# Patient Record
Sex: Female | Born: 2000 | Race: White | Hispanic: No | Marital: Single | Smoking: Current some day smoker
Health system: Southern US, Community
[De-identification: ages and names within clinical notes are randomized; demographics above are authoritative.]

## PROBLEM LIST (undated history)

## (undated) DIAGNOSIS — F988 Other specified behavioral and emotional disorders with onset usually occurring in childhood and adolescence: Secondary | ICD-10-CM

## (undated) DIAGNOSIS — J45909 Unspecified asthma, uncomplicated: Secondary | ICD-10-CM

## (undated) DIAGNOSIS — F32A Depression, unspecified: Secondary | ICD-10-CM

## (undated) DIAGNOSIS — F419 Anxiety disorder, unspecified: Secondary | ICD-10-CM

## (undated) DIAGNOSIS — T7840XA Allergy, unspecified, initial encounter: Secondary | ICD-10-CM

## (undated) DIAGNOSIS — F909 Attention-deficit hyperactivity disorder, unspecified type: Secondary | ICD-10-CM

## (undated) DIAGNOSIS — J4520 Mild intermittent asthma, uncomplicated: Secondary | ICD-10-CM

## (undated) HISTORY — DX: Mild intermittent asthma, uncomplicated: J45.20

## (undated) HISTORY — DX: Allergy, unspecified, initial encounter: T78.40XA

## (undated) HISTORY — PX: INTRAUTERINE DEVICE (IUD) INSERTION: SHX5877

## (undated) HISTORY — DX: Other specified behavioral and emotional disorders with onset usually occurring in childhood and adolescence: F98.8

## (undated) HISTORY — DX: Unspecified asthma, uncomplicated: J45.909

## (undated) HISTORY — DX: Anxiety disorder, unspecified: F41.9

## (undated) HISTORY — PX: WISDOM TOOTH EXTRACTION: SHX21

## (undated) HISTORY — DX: Attention-deficit hyperactivity disorder, unspecified type: F90.9

## (undated) HISTORY — DX: Depression, unspecified: F32.A

## (undated) HISTORY — PX: OTOPLASTY: SUR998

---
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
## 2022-01-05 ENCOUNTER — Encounter: Payer: Self-pay | Admitting: Obstetrics and Gynecology

## 2022-01-05 ENCOUNTER — Ambulatory Visit: Payer: BC Managed Care – PPO | Admitting: Obstetrics and Gynecology

## 2022-01-05 VITALS — BP 104/70 | HR 89 | Wt 114.0 lb

## 2022-01-05 DIAGNOSIS — N941 Unspecified dyspareunia: Secondary | ICD-10-CM | POA: Diagnosis not present

## 2022-01-05 DIAGNOSIS — R102 Pelvic and perineal pain unspecified side: Secondary | ICD-10-CM

## 2022-01-05 DIAGNOSIS — N760 Acute vaginitis: Secondary | ICD-10-CM

## 2022-01-05 LAB — WET PREP FOR TRICH, YEAST, CLUE

## 2022-01-05 NOTE — Progress Notes (Signed)
GYNECOLOGY  VISIT ?  ?HPI: ?21 y.o.   Single White or Caucasian Not Hispanic or Latino  female   ?No obstetric history on file. with No LMP recorded. (Menstrual status: IUD).   ?here for iud check, c/o cramping & vaginal itching  ? ?On 12/16/21 she had a mirena IUD inserted with ultrasound guidance. A few weeks prior she had a partially expulsed IUD removed.  ? ?She was doing fine until yesterday mid day. She had several episodes of severe stabbing pelvic pain, midline to her right side. The pain was lasting for a couple of minutes at time. The pain was up to 9/10 in severity. Currently her pain is gone, she had mild pain 3/10 in severity. ?She has been having vaginal spotting since prior to the IUD insertion.  ? ?She has only had one sexually partner, has intermittent entry discomfort with intercourse. Vagina can feel sore after sex. Not using condoms.  ?No deep dyspareunia. ? ?She has some mild intermittent vaginal itching. She had yeast in 4/23.  ? ?She has a h/o severe cramps, worsened on OCP's in the past.  ? ?She had her first IUD inserted in 10/21 and did really well on it until 4/23. ? ? ?GYNECOLOGIC HISTORY: ?No LMP recorded. (Menstrual status: IUD). ?Contraception:mirena iud inserted 12-16-21 ?Menopausal hormone therapy: none ?       ?OB History   ?No obstetric history on file. ?  ?    ? ?Patient Active Problem List  ? Diagnosis Date Noted  ? 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 Poor sleep hygiene 09/24/2020  ? Weight loss 09/24/2020  ? Lactose intolerance 08/27/2020  ? Peanut allergy 08/11/2020  ? Eczema 08/11/2020  ? Advanced sleep phase syndrome 03/28/2018  ? Environmental allergies 03/02/2012  ? ? ?Past Medical History:  ?Diagnosis Date  ? ADD (attention deficit disorder)   ? ADHD   ? Allergy   ? Phreesia 08/08/2020  ? Anxiety   ? [employer]reesia 08/08/2020  ? Asthma   ? Asthma   ? Phreesia 08/08/2020  ? Asthma, mild intermittent   ? Depression   ? Phreesia 08/08/2020  ? Low  back pain 09/28/2017  ? Pain of great toe, right 05/18/2017  ? Early valgus shift Normal great toe motion  ? Patellofemoral syndrome of right knee 06/26/2018  ? ? ?Past Surgical History:  ?Procedure Laterality Date  ? INTRAUTERINE DEVICE (IUD) INSERTION    ? mirena  ? OTOPLASTY    ? ? ?Current Outpatient Medications  ?Medication Sig Dispense Refill  ? hydrOXYzine (ATARAX/VISTARIL) 10 MG tablet Take 1 tablet (10 mg total) by mouth 3 (three) times daily as needed. 10 tablet 0  ? methylphenidate (RITALIN) 10 MG tablet Take 10 mg by mouth 2 (two) times daily.    ? tacrolimus (PROTOPIC) 0.1 % ointment Apply topically 2 (two) times daily. 100 g 0  ? ?No current facility-administered medications for this visit.  ?  ? ?ALLERGIES: Peanut-containing drug products, Molds & smuts, and Pollen extract ? ?Family History  ?Problem Relation Age of Onset  ? Spondylolisthesis Mother   ? Other Mother   ?     retinal tears  ? Food Allergy Mother   ? Environmental Allergies Mother   ? Asthma Mother   ? Depression Father   ?     lexapro very helpful  ? Hyperlipidemia Father   ? Arthritis Father   ?     s/p hip replacement  ? Bipolar disorder  Maternal Aunt   ?     Died of [place]  ? Migraines Paternal Aunt   ? Depression Paternal Aunt   ? Anxiety disorder Paternal Aunt   ? Colitis Maternal Grandmother   ? Arthritis Maternal Grandmother   ? Diabetes Maternal Grandfather   ? Heart disease Maternal Grandfather   ? Arthritis Maternal Grandfather   ? Fibromyalgia Paternal Grandmother   ? Rheum arthritis Paternal Grandmother   ? Depression Paternal Grandfather   ? Migraines Cousin   ? ADD / ADHD Cousin   ? Autism Cousin   ? Seizures Neg Hx   ? Schizophrenia Neg Hx   ? ? ?Social History  ? ?Socioeconomic History  ? Marital status: Single  ?  Spouse name: Not on file  ? Number of children: Not on file  ? Years of education: Not on file  ? Highest education level: Not on file  ?Occupational History  ? Not on file  ?Tobacco Use  ? Smoking status: Never  ?   Passive exposure: Never  ? Smokeless tobacco: Never  ?Substance and Sexual Activity  ? Alcohol use: Yes  ?  Comment: occ  ? Drug use: Never  ?  Comment: occ  ? Sexual activity: Yes  ?  Partners: Male  ?  Birth control/protection: I.U.D.  ?  Comment: mirena inserted 12-16-21  ?Other Topics Concern  ? Not on file  ?Social History Narrative  ? [name] is a rising 10th grade student at [employer]; she does well in school. She lives with both parents. She enjoys playing violin, playing volleyball, and hanging out with friends.   ? ?Social Determinants of Health  ? ?Financial Resource Strain: Not on file  ?Food Insecurity: Not on file  ?Transportation Needs: Not on file  ?Physical Activity: Not on file  ?Stress: Not on file  ?Social Connections: Not on file  ?Intimate Partner Violence: Not on file  ? ? ?Review of Systems  ?Constitutional: Negative.   ?HENT: Negative.    ?Eyes: Negative.   ?Respiratory: Negative.    ?Cardiovascular: Negative.   ?Gastrointestinal:   ?     Stomach cramping & vaginal itching  ?Genitourinary: Negative.   ?Musculoskeletal: Negative.   ?Skin: Negative.   ?Neurological: Negative.   ?Endo/Heme/Allergies: Negative.   ?Psychiatric/Behavioral: Negative.    ? ?PHYSICAL EXAMINATION:   ? ?BP 104/70   Pulse 89   Wt 114 lb (51.7 kg)   SpO2 92%   BMI 19.57 kg/m?     ?General appearance: alert, cooperative and appears stated age ?Abdomen: soft, mildly tender in the suprapubic and RLQ, no rebound, no guarding; non distended, no masses,  no organomegaly ? ?Pelvic: External genitalia:  no lesions, mild erythema ?             Urethra:  normal appearing urethra with no masses, tenderness or lesions ?             Bartholins and Skenes: normal    ?             Vagina: mildly erythematous appearing vagina with normal color and discharge, no lesions ?             Cervix: no cervical motion tenderness, no lesions, and IUD string 1 cm ?             Bimanual Exam:  Uterus:  normal size, contour, position,  consistency, mobility, non-tender and anteverted ?  Adnexa:  no masses, mildly tender in the right adnexa.  ?              ?Chaperone was present for exam. ? ?1. Pelvic pain ?Minimal right adnexal tenderness on exam. Recent negative genprobe ?- US PELVIS TRANSVAGINAL NON-OB (TV ONLY); Future ? ?2. Acute vaginitis ?- WET PREP FOR TRICH, YEAST, CLUE ?- SureSwab? Advanced Vaginitis, TMA ? ?3. Dyspareunia in female ?Send vaginitis panel ?Samples of uberlube given ? ? ?

## 2022-01-06 ENCOUNTER — Ambulatory Visit: Payer: BC Managed Care – PPO | Admitting: Obstetrics and Gynecology

## 2022-01-06 ENCOUNTER — Ambulatory Visit (INDEPENDENT_AMBULATORY_CARE_PROVIDER_SITE_OTHER): Payer: BC Managed Care – PPO

## 2022-01-06 ENCOUNTER — Encounter: Payer: Self-pay | Admitting: Obstetrics and Gynecology

## 2022-01-06 DIAGNOSIS — R102 Pelvic and perineal pain: Secondary | ICD-10-CM | POA: Diagnosis not present

## 2022-01-06 DIAGNOSIS — F4011 Social phobia, generalized: Secondary | ICD-10-CM | POA: Diagnosis not present

## 2022-01-06 DIAGNOSIS — F330 Major depressive disorder, recurrent, mild: Secondary | ICD-10-CM | POA: Diagnosis not present

## 2022-01-06 DIAGNOSIS — N83201 Unspecified ovarian cyst, right side: Secondary | ICD-10-CM

## 2022-01-06 NOTE — Progress Notes (Signed)
GYNECOLOGY  VISIT   HPI: 21 y.o.   Single White or Caucasian Not Hispanic or Latino  female   No obstetric history on file. with No LMP recorded. (Menstrual status: IUD).   here for f/u of pelvic pain. She had an IUD placed last month and presented yesterday with c/o severe pelvic pain the day prior to her visit. She continues to have intermittent pain, but it is improving. On exam yesterday she was noted to have mild tenderness in the right adnexa.   GYNECOLOGIC HISTORY: No LMP recorded. (Menstrual status: IUD). Contraception:IUD Menopausal hormone therapy: NA        OB History   No obstetric history on file.        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) 09/24/2020   Poor sleep hygiene 09/24/2020   Weight loss 09/24/2020   Lactose intolerance 08/27/2020   Peanut allergy 08/11/2020   Eczema 08/11/2020   Advanced sleep phase syndrome 03/28/2018   Environmental allergies 03/02/2012    Past Medical History:  Diagnosis Date   ADD (attention deficit disorder)    ADHD    Allergy    Phreesia 08/08/2020   Anxiety    Phreesia 08/08/2020   Asthma    Asthma    Phreesia 08/08/2020   Asthma, mild intermittent    Depression    Phreesia 08/08/2020   Low back pain 09/28/2017   Pain of great toe, right 05/18/2017   Early valgus shift Normal great toe motion   Patellofemoral syndrome of right knee 06/26/2018    Past Surgical History:  Procedure Laterality Date   INTRAUTERINE DEVICE (IUD) INSERTION     mirena   OTOPLASTY      Current Outpatient Medications  Medication Sig Dispense Refill   hydrOXYzine (ATARAX/VISTARIL) 10 MG tablet Take 1 tablet (10 mg total) by mouth 3 (three) times daily as needed. 10 tablet 0   methylphenidate (RITALIN) 10 MG tablet Take 10 mg by mouth 2 (two) times daily.     tacrolimus (PROTOPIC) 0.1 % ointment Apply topically 2 (two) times daily. 100 g 0   No current  facility-administered medications for this visit.     ALLERGIES: Peanut-containing drug products, Molds & smuts, and Pollen extract  Family History  Problem Relation Age of Onset   Spondylolisthesis Mother    Other Mother        retinal tears   Food Allergy Mother    Environmental Allergies Mother    Asthma Mother    Depression Father        lexapro very helpful   Hyperlipidemia Father    Arthritis Father        s/p hip replacement   Bipolar disorder Maternal Aunt        Died of [place]   Migraines Paternal Aunt    Depression Paternal Aunt    Anxiety disorder Paternal Aunt    Colitis Maternal Grandmother    Arthritis Maternal Grandmother    Diabetes Maternal Grandfather    Heart disease Maternal Grandfather    Arthritis Maternal Grandfather    Fibromyalgia Paternal Grandmother    Rheum arthritis Paternal Grandmother    Depression Paternal Grandfather    Migraines Cousin    ADD / ADHD Cousin    Autism Cousin    Seizures Neg Hx    Schizophrenia Neg Hx     Social History   Socioeconomic History   Marital status: Single  Spouse name: Not on file   Number of children: Not on file   Years of education: Not on file   Highest education level: Not on file  Occupational History   Not on file  Tobacco Use   Smoking status: Never    Passive exposure: Never   Smokeless tobacco: Never  Substance and Sexual Activity   Alcohol use: Yes    Comment: occ   Drug use: Never    Comment: occ   Sexual activity: Yes    Partners: Male    Birth control/protection: I.U.D.    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 She lives with both parents. She enjoys playing violin, playing volleyball, and hanging out with friends.    Social Determinants of Health   Financial Resource Strain: Not on file  Food Insecurity: Not on file  Transportation  Needs: Not on file  Physical Activity: Not on file  Stress: Not on file  Social Connections: Not on file  Intimate Partner Violence: Not on file    ROS  PHYSICAL EXAMINATION:    There were no vitals taken for this visit.    General appearance: alert, cooperative and appears stated age  Pelvic ultrasound  Indications: Pelvic pain  Findings:  Uterus 6.61 x 4.2 x 3.09 cm  Endometrium 1.75 mm IUD is best seen on 3D imaging, the IUD is in the uterine cavity, but is lower in the cavity than it was. By my measurement the body of the IUD is now 42mm from the fundus. One arm is lower than the other  Left ovary 2.24 x 1.26 x 1.46 cm  Right ovary 4.10 x 3.74 x 3.73 cm 3.77 x 3.34 cm complex cyst, c/w hemorrhagic CL Normal perfusion  No free fluid   Impression:  Normal sized, anteverted uterus Hemorrhagic cyst in the right ovary IUD in the uterine cavity, but has moved down some  Thin endometrium Normal left ovary  1. Right ovarian cyst Hemorrhagic cyst on the right - US PELVIS TRANSVAGINAL NON-OB (TV ONLY); Future  2. Pelvic pain I think her pain is from the hemorrhagic cyst, that is where she is most tender. Her IUD has moved down in her cavity slightly, but is still in her cavity. She previously expulsed an IUD - US PELVIS TRANSVAGINAL NON-OB (TV ONLY); Future in 2 months

## 2022-01-07 LAB — SURESWAB® ADVANCED VAGINITIS,TMA
CANDIDA SPECIES: NOT DETECTED
Candida glabrata: NOT DETECTED
SURESWAB(R) ADV BACTERIAL VAGINOSIS(BV),TMA: NEGATIVE
TRICHOMONAS VAGINALIS (TV),TMA: NOT DETECTED

## 2022-02-03 DIAGNOSIS — F330 Major depressive disorder, recurrent, mild: Secondary | ICD-10-CM | POA: Diagnosis not present

## 2022-02-03 DIAGNOSIS — F4011 Social phobia, generalized: Secondary | ICD-10-CM | POA: Diagnosis not present

## 2022-02-10 DIAGNOSIS — F4011 Social phobia, generalized: Secondary | ICD-10-CM | POA: Diagnosis not present

## 2022-02-10 DIAGNOSIS — F330 Major depressive disorder, recurrent, mild: Secondary | ICD-10-CM | POA: Diagnosis not present

## 2022-02-17 DIAGNOSIS — F988 Other specified behavioral and emotional disorders with onset usually occurring in childhood and adolescence: Secondary | ICD-10-CM | POA: Diagnosis not present

## 2022-02-17 DIAGNOSIS — F330 Major depressive disorder, recurrent, mild: Secondary | ICD-10-CM | POA: Diagnosis not present

## 2022-02-17 DIAGNOSIS — Z79899 Other long term (current) drug therapy: Secondary | ICD-10-CM | POA: Diagnosis not present

## 2022-02-17 DIAGNOSIS — F419 Anxiety disorder, unspecified: Secondary | ICD-10-CM | POA: Diagnosis not present

## 2022-02-18 DIAGNOSIS — F4011 Social phobia, generalized: Secondary | ICD-10-CM | POA: Diagnosis not present

## 2022-02-18 DIAGNOSIS — F330 Major depressive disorder, recurrent, mild: Secondary | ICD-10-CM | POA: Diagnosis not present

## 2022-02-24 DIAGNOSIS — F4011 Social phobia, generalized: Secondary | ICD-10-CM | POA: Diagnosis not present

## 2022-02-24 DIAGNOSIS — F330 Major depressive disorder, recurrent, mild: Secondary | ICD-10-CM | POA: Diagnosis not present

## 2022-03-02 DIAGNOSIS — J3081 Allergic rhinitis due to animal (cat) (dog) hair and dander: Secondary | ICD-10-CM | POA: Diagnosis not present

## 2022-03-02 DIAGNOSIS — Z91010 Allergy to peanuts: Secondary | ICD-10-CM | POA: Diagnosis not present

## 2022-03-02 DIAGNOSIS — J4530 Mild persistent asthma, uncomplicated: Secondary | ICD-10-CM | POA: Diagnosis not present

## 2022-03-02 DIAGNOSIS — J301 Allergic rhinitis due to pollen: Secondary | ICD-10-CM | POA: Diagnosis not present

## 2022-03-02 DIAGNOSIS — J3089 Other allergic rhinitis: Secondary | ICD-10-CM | POA: Diagnosis not present

## 2022-03-03 DIAGNOSIS — F4011 Social phobia, generalized: Secondary | ICD-10-CM | POA: Diagnosis not present

## 2022-03-03 DIAGNOSIS — F330 Major depressive disorder, recurrent, mild: Secondary | ICD-10-CM | POA: Diagnosis not present

## 2022-03-10 ENCOUNTER — Ambulatory Visit (INDEPENDENT_AMBULATORY_CARE_PROVIDER_SITE_OTHER): Payer: BC Managed Care – PPO | Admitting: Obstetrics and Gynecology

## 2022-03-10 ENCOUNTER — Ambulatory Visit (INDEPENDENT_AMBULATORY_CARE_PROVIDER_SITE_OTHER): Payer: BC Managed Care – PPO

## 2022-03-10 ENCOUNTER — Encounter: Payer: Self-pay | Admitting: Obstetrics and Gynecology

## 2022-03-10 DIAGNOSIS — Z8742 Personal history of other diseases of the female genital tract: Secondary | ICD-10-CM | POA: Diagnosis not present

## 2022-03-10 DIAGNOSIS — R102 Pelvic and perineal pain: Secondary | ICD-10-CM

## 2022-03-10 DIAGNOSIS — N83201 Unspecified ovarian cyst, right side: Secondary | ICD-10-CM

## 2022-03-10 DIAGNOSIS — T8332XD Displacement of intrauterine contraceptive device, subsequent encounter: Secondary | ICD-10-CM | POA: Diagnosis not present

## 2022-03-10 DIAGNOSIS — F330 Major depressive disorder, recurrent, mild: Secondary | ICD-10-CM | POA: Diagnosis not present

## 2022-03-10 DIAGNOSIS — Z3009 Encounter for other general counseling and advice on contraception: Secondary | ICD-10-CM | POA: Diagnosis not present

## 2022-03-10 DIAGNOSIS — F4011 Social phobia, generalized: Secondary | ICD-10-CM | POA: Diagnosis not present

## 2022-03-10 NOTE — Progress Notes (Signed)
GYNECOLOGY  VISIT   HPI: 21 y.o.   Single White or Caucasian Not Hispanic or Latino  female   No obstetric history on file. with No LMP recorded. (Menstrual status: IUD).   here for Ultrasound consult.    She has a h/o a partially expulsed IUD, had the IUD removed on 12/02/21 and another one placed with u/s guidance. IUD in proper location after ultrasound guided insertion on 12/16/21.   She then returned on 01/06/22 for an ultrasound for pelvic pain. U/S showed a complex 3.8 cm right ovarian cyst and her IUD was noted to be lower in her uterine cavity, 71mm from the fundus (one arm lower than the other as well).   She presented today for a f/u ultrasound. She is not currently having any pain. She had her cycle last week and had moderate cramping. Not as bad as it has been previously.  She is sexually active, same partner. Doesn't typically have pain with sex although she did have pain last week during intercourse. She didn't have any pain with the ultrasound today.   She has a h/o an aura x 1, so went off of OCP's. She had mood changes on the micronor previously.   She needs contraception, but also needs control of severe dysmenorrhea.   GYNECOLOGIC HISTORY: No LMP recorded. (Menstrual status: IUD). Contraception:IUD Menopausal hormone therapy: none         OB History   No obstetric history on file.        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) 09/24/2020   Poor sleep hygiene 09/24/2020   Weight loss 09/24/2020   Lactose intolerance 08/27/2020   Peanut allergy 08/11/2020   Eczema 08/11/2020   Advanced sleep phase syndrome 03/28/2018   Environmental allergies 03/02/2012    Past Medical History:  Diagnosis Date   ADD (attention deficit disorder)    ADHD    Allergy    Phreesia 08/08/2020   Anxiety    Phreesia 08/08/2020   Asthma    Asthma    Phreesia 08/08/2020   Asthma, mild intermittent     Depression    Phreesia 08/08/2020   Low back pain 09/28/2017   Pain of great toe, right 05/18/2017   Early valgus shift Normal great toe motion   Patellofemoral syndrome of right knee 06/26/2018    Past Surgical History:  Procedure Laterality Date   INTRAUTERINE DEVICE (IUD) INSERTION     mirena   OTOPLASTY      Current Outpatient Medications  Medication Sig Dispense Refill   hydrOXYzine (ATARAX/VISTARIL) 10 MG tablet Take 1 tablet (10 mg total) by mouth 3 (three) times daily as needed. 10 tablet 0   methylphenidate (RITALIN) 10 MG tablet Take 10 mg by mouth 2 (two) times daily.     tacrolimus (PROTOPIC) 0.1 % ointment Apply topically 2 (two) times daily. 100 g 0   No current facility-administered medications for this visit.     ALLERGIES: Peanut-containing drug products, Molds & smuts, and Pollen extract  Family History  Problem Relation Age of Onset   Spondylolisthesis Mother    Other Mother        retinal tears   Food Allergy Mother    Environmental Allergies Mother    Asthma Mother    Depression Father        lexapro very helpful   Hyperlipidemia Father    Arthritis Father  s/p hip replacement   Bipolar disorder Maternal Aunt        Died of COVID   Migraines Paternal Aunt    Depression Paternal Aunt    Anxiety disorder Paternal Aunt    Colitis Maternal Grandmother    Arthritis Maternal Grandmother    Diabetes Maternal Grandfather    Heart disease Maternal Grandfather    Arthritis Maternal Grandfather    Fibromyalgia Paternal Grandmother    Rheum arthritis Paternal Grandmother    Depression Paternal Grandfather    Migraines Cousin    ADD / ADHD Cousin    Autism Cousin    Seizures Neg Hx    Schizophrenia Neg Hx     Social History   Socioeconomic History   Marital status: Single    Spouse name: Not on file   Number of children: Not on file   Years of education: Not on file   Highest education level: Not on file  Occupational History   Not on file   Tobacco Use   Smoking status: Never    Passive exposure: Never   Smokeless tobacco: Never  Substance and Sexual Activity   Alcohol use: Yes    Comment: occ   Drug use: Never    Comment: occ   Sexual activity: Yes    Partners: Male    Birth control/protection: I.U.D.    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 She lives with both parents. She enjoys playing violin, playing volleyball, and hanging out with friends.    Social Determinants of Health   Financial Resource Strain: Not on file  Food Insecurity: Not on file  Transportation Needs: Not on file  Physical Activity: Not on file  Stress: Not on file  Social Connections: Not on file  Intimate Partner Violence: Not on file    Review of Systems  All other systems reviewed and are negative.   PHYSICAL EXAMINATION:    There were no vitals taken for this visit.    General appearance: alert, cooperative and appears stated age  Ultrasound images reviewed. Prior cyst has resolved. Her IUD is moving lower in her uterine cavity, now just above her cervix. One arm is lower than the other.  1. Malpositioned intrauterine device (IUD), subsequent encounter The IUD is slowly moving down in her uterine cavity. I recommend removal. She is going to [place] and doesn't want to remove the IUD prior to her trip. Not currently in pain. We discussed that there is a risk that the IUD will continue to expulse and could cause her pain. If that were to happen when she was gone, she could go to a clinic and ask for removal. -She will consider, if she wants me to remove it prior to her trip she will call -recommend she use condoms  2. General counseling and advice on female contraception We discussed other options for contraception, unless she has Neurology approval I would be very hesitant to start her on OCP's  given her h/o aura x 1. Other options are POP (previously had mood changes), nexplanon, depo-provera. Discussed possible side effects with each.  She will let me know how she wants to proceed.  3. History of dysmenorrhea  In addition to reviewing the ultrasound results, over 20 minutes was spent in total patient care (I saw her prior to her being added onto  my schedule for a visit).

## 2022-03-11 ENCOUNTER — Telehealth: Payer: Self-pay | Admitting: *Deleted

## 2022-03-11 DIAGNOSIS — G43101 Migraine with aura, not intractable, with status migrainosus: Secondary | ICD-10-CM

## 2022-03-11 NOTE — Telephone Encounter (Signed)
Dr.Jertson called in triage asking me to place a referral for patient. Dr.Jertson said patient mother called her last night and asked if referral could be placed for neurology for migraine with aura.   Referral placed.

## 2022-03-14 ENCOUNTER — Encounter: Payer: Self-pay | Admitting: Obstetrics and Gynecology

## 2022-03-14 ENCOUNTER — Ambulatory Visit (INDEPENDENT_AMBULATORY_CARE_PROVIDER_SITE_OTHER): Payer: BC Managed Care – PPO | Admitting: Obstetrics and Gynecology

## 2022-03-14 VITALS — BP 110/68 | HR 99 | Ht 64.0 in | Wt 114.0 lb

## 2022-03-14 DIAGNOSIS — Z30432 Encounter for removal of intrauterine contraceptive device: Secondary | ICD-10-CM

## 2022-03-14 NOTE — Progress Notes (Signed)
GYNECOLOGY  VISIT   HPI: 21 y.o.   Single White or Caucasian Not Hispanic or Latino  female   No obstetric history on file. with No LMP recorded. (Menstrual status: IUD).   here for  IUD removal. The IUD was noted to be in her LUS on imaging last week. She restarted POP last week.   GYNECOLOGIC HISTORY: No LMP recorded. (Menstrual status: IUD). Contraception:IUD  Menopausal hormone therapy: none         OB History   No obstetric history on file.        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) 09/24/2020   Poor sleep hygiene 09/24/2020   Weight loss 09/24/2020   Lactose intolerance 08/27/2020   Peanut allergy 08/11/2020   Eczema 08/11/2020   Advanced sleep phase syndrome 03/28/2018   Environmental allergies 03/02/2012    Past Medical History:  Diagnosis Date   ADD (attention deficit disorder)    ADHD    Allergy    Phreesia 08/08/2020   Anxiety    Phreesia 08/08/2020   Asthma    Asthma    Phreesia 08/08/2020   Asthma, mild intermittent    Depression    Phreesia 08/08/2020   Low back pain 09/28/2017   Pain of great toe, right 05/18/2017   Early valgus shift Normal great toe motion   Patellofemoral syndrome of right knee 06/26/2018    Past Surgical History:  Procedure Laterality Date   INTRAUTERINE DEVICE (IUD) INSERTION     mirena   OTOPLASTY      Current Outpatient Medications  Medication Sig Dispense Refill   hydrOXYzine (ATARAX/VISTARIL) 10 MG tablet Take 1 tablet (10 mg total) by mouth 3 (three) times daily as needed. 10 tablet 0   methylphenidate (RITALIN) 10 MG tablet Take 10 mg by mouth 2 (two) times daily.     tacrolimus (PROTOPIC) 0.1 % ointment Apply topically 2 (two) times daily. 100 g 0   No current facility-administered medications for this visit.     ALLERGIES: Peanut-containing drug products, Molds & smuts, and Pollen extract  Family History  Problem Relation Age of  Onset   Spondylolisthesis Mother    Other Mother        retinal tears   Food Allergy Mother    Environmental Allergies Mother    Asthma Mother    Depression Father        lexapro very helpful   Hyperlipidemia Father    Arthritis Father        s/p hip replacement   Bipolar disorder Maternal Aunt        Died of COVID   Migraines Paternal Aunt    Depression Paternal Aunt    Anxiety disorder Paternal Aunt    Colitis Maternal Grandmother    Arthritis Maternal Grandmother    Diabetes Maternal Grandfather    Heart disease Maternal Grandfather    Arthritis Maternal Grandfather    Fibromyalgia Paternal Grandmother    Rheum arthritis Paternal Grandmother    Depression Paternal Grandfather    Migraines Cousin    ADD / ADHD Cousin    Autism Cousin    Seizures Neg Hx    Schizophrenia Neg Hx     Social History   Socioeconomic History   Marital status: Single    Spouse name: Not on file   Number of children: Not on file   Years of education: Not on file   Highest  education level: Not on file  Occupational History   Not on file  Tobacco Use   Smoking status: Never    Passive exposure: Never   Smokeless tobacco: Never  Substance and Sexual Activity   Alcohol use: Yes    Comment: occ   Drug use: Never    Comment: occ   Sexual activity: Yes    Partners: Male    Birth control/protection: I.U.D.    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 She lives with both parents. She enjoys playing violin, playing volleyball, and hanging out with friends.    Social Determinants of Health   Financial Resource Strain: Not on file  Food Insecurity: Not on file  Transportation Needs: Not on file  Physical Activity: Not on file  Stress: Not on file  Social Connections: Not on file  Intimate Partner Violence: Not on file    Review of Systems  All other  systems reviewed and are negative.   PHYSICAL EXAMINATION:    There were no vitals taken for this visit.    General appearance: alert, cooperative and appears stated age  Pelvic: External genitalia:  no lesions              Urethra:  normal appearing urethra with no masses, tenderness or lesions              Bartholins and Skenes: normal                 Vagina: normal appearing vagina with normal color and discharge, no lesions              Cervix: no lesions and IUD strings 3-4 cm, IUD pulled with ringed forceps               Chaperone was present for exam.  1. Encounter for IUD removal IUD removed She is on POP, aware she needs to use condoms for one week for back up contraception. Condom use encouraged for STD protection.

## 2022-03-16 ENCOUNTER — Encounter: Payer: Self-pay | Admitting: Obstetrics and Gynecology

## 2022-03-16 ENCOUNTER — Telehealth: Payer: Self-pay | Admitting: *Deleted

## 2022-03-16 ENCOUNTER — Ambulatory Visit: Payer: BC Managed Care – PPO | Admitting: Obstetrics and Gynecology

## 2022-03-16 VITALS — BP 118/80 | Ht 64.0 in | Wt 114.0 lb

## 2022-03-16 DIAGNOSIS — N939 Abnormal uterine and vaginal bleeding, unspecified: Secondary | ICD-10-CM | POA: Diagnosis not present

## 2022-03-16 MED ORDER — NORETHINDRONE ACETATE 5 MG PO TABS: ORAL_TABLET | ORAL | 0 refills | Status: DC

## 2022-03-16 NOTE — Progress Notes (Signed)
GYNECOLOGY  VISIT   HPI: 21 y.o.   Single White or Caucasian Not Hispanic or Latino  female   No obstetric history on file. with Patient's last menstrual period was 03/02/2022.   here for irregular bleeding. She had an ultrasound last week and was noted to have a partially expulsed IUD. She started on micronor last Thursday and had her IUD pulled on Monday.  She reports heavy bleeding overnight, saturating a super tampon overnight and then again in 2-3 hours this morning. Currently bleeding has slowed down. No pain.   GYNECOLOGIC HISTORY: Patient's last menstrual period was 03/02/2022. 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) 09/24/2020   Poor sleep hygiene 09/24/2020   Weight loss 09/24/2020   Lactose intolerance 08/27/2020   Peanut allergy 08/11/2020   Eczema 08/11/2020   Advanced sleep phase syndrome 03/28/2018   Environmental allergies 03/02/2012    Past Medical History:  Diagnosis Date   ADD (attention deficit disorder)    ADHD    Allergy    Phreesia 08/08/2020   Anxiety    Phreesia 08/08/2020   Asthma    Asthma    Phreesia 08/08/2020   Asthma, mild intermittent    Depression    Phreesia 08/08/2020   Low back pain 09/28/2017   Pain of great toe, right 05/18/2017   Early valgus shift Normal great toe motion   Patellofemoral syndrome of right knee 06/26/2018    Past Surgical History:  Procedure Laterality Date   INTRAUTERINE DEVICE (IUD) INSERTION     mirena   OTOPLASTY      Current Outpatient Medications  Medication Sig Dispense Refill   hydrOXYzine (ATARAX/VISTARIL) 10 MG tablet Take 1 tablet (10 mg total) by mouth 3 (three) times daily as  needed. 10 tablet 0   methylphenidate (RITALIN) 10 MG tablet Take 10 mg by mouth 2 (two) times daily.     norethindrone (MICRONOR) 0.35 MG tablet Take 1 tablet by mouth daily.     tacrolimus (PROTOPIC) 0.1 % ointment Apply topically 2 (two) times daily. 100 g 0   No current facility-administered medications for this visit.     ALLERGIES: Peanut-containing drug products, Molds & smuts, and Pollen extract  Family History  Problem Relation Age of Onset   Spondylolisthesis Mother    Other Mother        retinal tears   Food Allergy Mother    Environmental Allergies Mother    Asthma Mother    Depression Father        lexapro very helpful   Hyperlipidemia Father    Arthritis Father        s/p hip replacement   Bipolar disorder Maternal Aunt        Died of COVID   Migraines Paternal Aunt    Depression Paternal Aunt    Anxiety disorder Paternal Aunt    Colitis Maternal Grandmother    Arthritis Maternal  Grandmother    Diabetes Maternal Grandfather    Heart disease Maternal Grandfather    Arthritis Maternal Grandfather    Fibromyalgia Paternal Grandmother    Rheum arthritis Paternal Grandmother    Depression Paternal Grandfather    Migraines Cousin    ADD / ADHD Cousin    Autism Cousin    Seizures Neg Hx    Schizophrenia Neg Hx     Social History   Socioeconomic History   Marital status: Single    Spouse name: Not on file   Number of children: Not on file   Years of education: Not on file   Highest education level: Not on file  Occupational History   Not on file  Tobacco Use   Smoking status: Never    Passive exposure: Never   Smokeless tobacco: Never  Substance and Sexual Activity   Alcohol use: Yes    Comment: occ   Drug use: Never    Comment: occ   Sexual activity: Yes    Partners: Male    Birth control/protection: I.U.D.    Comment: mirena inserted 12-16-21  Other Topics Concern   Not on file  Social History Narrative   Barbara Mcmillan is a rising 10th grade student  at [employer]; she does well in school. She lives with both parents. She enjoys playing violin, playing volleyball, and hanging out with friends.    Social Determinants of Health   Financial Resource Strain: Not on file  Food Insecurity: Not on file  Transportation Needs: Not on file  Physical Activity: Not on file  Stress: Not on file  Social Connections: Not on file  Intimate Partner Violence: Not on file    ROS  PHYSICAL EXAMINATION:    BP 118/80 (BP Location: Right Arm, Patient Position: Sitting)   Ht 5\' 4"  (1.626 m)   Wt 114 lb (51.7 kg)   LMP 03/02/2022   SpO2 99%   BMI 19.57 kg/m     General appearance: alert, cooperative and appears stated age  Pelvic: External genitalia:  no lesions              Urethra:  normal appearing urethra with no masses, tenderness or lesions              Bartholins and Skenes: normal                 Vagina: normal appearing vagina with normal color and discharge, no lesions. There is a small amount of blood in the vagina              Cervix: no lesions and no active bleeding              Bimanual Exam:  Uterus:  normal size, contour, position, consistency, mobility, non-tender              Adnexa: no mass, fullness, tenderness               Chaperone was present for exam.  1. Abnormal uterine bleeding (AUB) The patient had a malpositioned (expulsing) IUD removed on Monday, she started on micronor last week. She reports heavy bleeding overnight and this am. She is going to [place] on Friday and is concerned about having heavy bleeding there. -Currently on exam she is not actively bleeding. Patient was reassured that I don't feel anything dangerous is happening. With any hormonal changes there can be some bleeding, I can't predict how long this will go on for, but don't expect her  to hemorrhage. Will give her a script of Aygestin to take to [place] with her, she will only take it if she is bleeding through a tampon an hour.  - norethindrone  (AYGESTIN) 5 MG tablet; If your bleeding gets very heavy, take one tablet a day until bleeding subsides, then take one tablet a day.  Dispense: 20 tablet; Refill: 0 -F/U as needed

## 2022-03-16 NOTE — Telephone Encounter (Signed)
Dr.Jertson asked to me call patient had IUD removed 03/10/22 reports her mother sent text to Dr.Jertson about this. I called patient and she asked to speak with Dr. Oscar La I explained she is seeing patient but she could talk to me. Patient said bleeding started last night and it was heavy, this am she reports wearing both tampon and pad. States she has not changed tampon/pad this am yet, but is not changing less than 1 hour so far. I told her Dr. Oscar La would like to see her at 3:45pm as work in. Patient scheduled by appointments.

## 2022-03-17 NOTE — Telephone Encounter (Signed)
Per referral coordinator from Neurology "Spoke w pt and offered our first available on 8/18. She believes she'll be moving for college by then so she wanted to hold off. Will give [place] a call if anything changes or if she decides to schedule"  Encounter/referral closed.

## 2022-03-22 ENCOUNTER — Other Ambulatory Visit: Payer: Self-pay

## 2022-03-31 DIAGNOSIS — F4011 Social phobia, generalized: Secondary | ICD-10-CM | POA: Diagnosis not present

## 2022-03-31 DIAGNOSIS — F330 Major depressive disorder, recurrent, mild: Secondary | ICD-10-CM | POA: Diagnosis not present

## 2022-04-07 DIAGNOSIS — F330 Major depressive disorder, recurrent, mild: Secondary | ICD-10-CM | POA: Diagnosis not present

## 2022-04-07 DIAGNOSIS — F4011 Social phobia, generalized: Secondary | ICD-10-CM | POA: Diagnosis not present

## 2022-04-11 DIAGNOSIS — F4011 Social phobia, generalized: Secondary | ICD-10-CM | POA: Diagnosis not present

## 2022-04-11 DIAGNOSIS — F330 Major depressive disorder, recurrent, mild: Secondary | ICD-10-CM | POA: Diagnosis not present

## 2022-04-12 DIAGNOSIS — F330 Major depressive disorder, recurrent, mild: Secondary | ICD-10-CM | POA: Diagnosis not present

## 2022-04-12 DIAGNOSIS — F4011 Social phobia, generalized: Secondary | ICD-10-CM | POA: Diagnosis not present

## 2022-04-18 DIAGNOSIS — F4011 Social phobia, generalized: Secondary | ICD-10-CM | POA: Diagnosis not present

## 2022-04-18 DIAGNOSIS — F330 Major depressive disorder, recurrent, mild: Secondary | ICD-10-CM | POA: Diagnosis not present

## 2022-04-28 ENCOUNTER — Ambulatory Visit
Admission: RE | Admit: 2022-04-28 | Discharge: 2022-04-28 | Disposition: A | Discharge status: Home / Self Care | Payer: BC Managed Care – PPO | Source: Ambulatory Visit | Attending: Urgent Care | Admitting: Urgent Care

## 2022-04-28 VITALS — BP 109/73 | HR 98 | Temp 98.4°F | Resp 18

## 2022-04-28 DIAGNOSIS — Z1152 Encounter for screening for COVID-19: Secondary | ICD-10-CM

## 2022-04-28 DIAGNOSIS — U071 COVID-19: Secondary | ICD-10-CM | POA: Diagnosis not present

## 2022-04-28 DIAGNOSIS — Z87891 Personal history of nicotine dependence: Secondary | ICD-10-CM

## 2022-04-28 DIAGNOSIS — J4530 Mild persistent asthma, uncomplicated: Secondary | ICD-10-CM | POA: Diagnosis not present

## 2022-04-28 DIAGNOSIS — J309 Allergic rhinitis, unspecified: Secondary | ICD-10-CM

## 2022-04-28 MED ORDER — PAXLOVID (300/100) 20 X 150 MG & 10 X 100MG PO TBPK: ORAL_TABLET | ORAL | 0 refills | Status: DC

## 2022-04-28 MED ORDER — PSEUDOEPHEDRINE HCL 30 MG PO TABS: 30.0000 mg | ORAL_TABLET | Freq: Three times a day (TID) | ORAL | 0 refills | Status: DC | PRN

## 2022-04-28 MED ORDER — CETIRIZINE HCL 10 MG PO TABS: 10.0000 mg | ORAL_TABLET | Freq: Every day | ORAL | 0 refills | Status: DC

## 2022-04-28 MED ORDER — PROMETHAZINE-DM 6.25-15 MG/5ML PO SYRP: 2.5000 mL | ORAL_SOLUTION | Freq: Three times a day (TID) | ORAL | 0 refills | Status: DC | PRN

## 2022-04-28 MED ORDER — ALBUTEROL SULFATE HFA 108 (90 BASE) MCG/ACT IN AERS: 1.0000 | INHALATION_SPRAY | Freq: Four times a day (QID) | RESPIRATORY_TRACT | 0 refills | Status: AC | PRN

## 2022-04-28 NOTE — ED Triage Notes (Signed)
Pt here with cough and congestion and positive covid test x 4 days. Pt needs official covid test and letter to go back on Monday.

## 2022-04-28 NOTE — ED Provider Notes (Signed)
[facility]  Note:  This document was prepared using Conservation officer, historic buildings and may include unintentional dictation errors.  MRN: [id number] DOB: 07-03-2001  Subjective:   Barbara Mcmillan is a 21 y.o. female presenting for COVID testing.  Patient had a positive test and needs a confirmation for her schooling.  She has a history of asthma.  Would like COVID antiviral medication.  No history of kidney disease.  Patient has had persistent coughing, sinus congestion and drainage.  She has been smoking intermittently with cigarettes or vaping.    No current facility-administered medications for this encounter.  Current Outpatient Medications:    hydrOXYzine (ATARAX/VISTARIL) 10 MG tablet, Take 1 tablet (10 mg total) by mouth 3 (three) times daily as needed., Disp: 10 tablet, Rfl: 0   methylphenidate (RITALIN) 10 MG tablet, Take 10 mg by mouth 2 (two) times daily., Disp: , Rfl:    norethindrone (AYGESTIN) 5 MG tablet, If your bleeding gets very heavy, take one tablet a day until bleeding subsides, then take one tablet a day., Disp: 20 tablet, Rfl: 0   norethindrone (MICRONOR) 0.35 MG tablet, Take 1 tablet by mouth daily., Disp: , Rfl:    tacrolimus (PROTOPIC) 0.1 % ointment, Apply topically 2 (two) times daily., Disp: 100 g, Rfl: 0   Allergies  Allergen Reactions   Peanut-Containing Drug Products Anaphylaxis, Hives, Itching, Rash, Shortness Of Breath and Swelling   Molds & Smuts    Pollen Extract     Past Medical History:  Diagnosis Date   ADD (attention deficit disorder)    ADHD    Allergy    Phreesia 08/08/2020   Anxiety    Phreesia 08/08/2020   Asthma    Asthma    Phreesia 08/08/2020   Asthma, mild intermittent    Depression    Phreesia 08/08/2020   Low back pain 09/28/2017   Pain of great toe, right 05/18/2017   Early valgus shift Normal great toe motion   Patellofemoral syndrome of right knee 06/26/2018     Past Surgical History:   Procedure Laterality Date   INTRAUTERINE DEVICE (IUD) INSERTION     mirena   OTOPLASTY      Family History  Problem Relation Age of Onset   Spondylolisthesis Mother    Other Mother        retinal tears   Food Allergy Mother    Environmental Allergies Mother    Asthma Mother    Depression Father        lexapro very helpful   Hyperlipidemia Father    Arthritis Father        s/p hip replacement   Bipolar disorder Maternal Aunt        Died of COVID   Migraines Paternal Aunt    Depression Paternal Aunt    Anxiety disorder Paternal Aunt    Colitis Maternal Grandmother    Arthritis Maternal Grandmother    Diabetes Maternal Grandfather    Heart disease Maternal Grandfather    Arthritis Maternal Grandfather    Fibromyalgia Paternal Grandmother    Rheum arthritis Paternal Grandmother    Depression Paternal Grandfather    Migraines Cousin    ADD / ADHD Cousin    Autism Cousin    Seizures Neg Hx    Schizophrenia Neg Hx     Social History   Tobacco Use   Smoking status: Never    Passive exposure: Never   Smokeless tobacco: Never  Substance Use Topics  Alcohol use: Yes    Comment: occ   Drug use: Never    Comment: occ    ROS   Objective:   Vitals: BP 109/73   Pulse 98   Temp 98.4 F (36.9 C)   Resp 18   SpO2 98%   Physical Exam Constitutional:      General: She is not in acute distress.    Appearance: Normal appearance. She is well-developed and normal weight. She is not ill-appearing, toxic-appearing or diaphoretic.  HENT:     Head: Normocephalic and atraumatic.     Right Ear: Tympanic membrane, ear canal and external ear normal. No drainage or tenderness. No middle ear effusion. There is no impacted cerumen. Tympanic membrane is not erythematous.     Left Ear: Tympanic membrane, ear canal and external ear normal. No drainage or tenderness.  No middle ear effusion. There is no impacted cerumen. Tympanic membrane is not erythematous.     Nose: Congestion  and rhinorrhea present.     Mouth/Throat:     Mouth: Mucous membranes are moist. No oral lesions.     Pharynx: No pharyngeal swelling, oropharyngeal exudate, posterior oropharyngeal erythema or uvula swelling.     Tonsils: No tonsillar exudate or tonsillar abscesses.  Eyes:     General: No scleral icterus.       Right eye: No discharge.        Left eye: No discharge.     Extraocular Movements: Extraocular movements intact.     Right eye: Normal extraocular motion.     Left eye: Normal extraocular motion.     Conjunctiva/sclera: Conjunctivae normal.  Cardiovascular:     Rate and Rhythm: Normal rate and regular rhythm.     Heart sounds: Normal heart sounds. No murmur heard.    No friction rub. No gallop.  Pulmonary:     Effort: Pulmonary effort is normal. No respiratory distress.     Breath sounds: No stridor. No wheezing, rhonchi or rales.  Chest:     Chest wall: No tenderness.  Musculoskeletal:     Cervical back: Normal range of motion and neck supple.  Lymphadenopathy:     Cervical: No cervical adenopathy.  Skin:    General: Skin is warm and dry.  Neurological:     General: No focal deficit present.     Mental Status: She is alert and oriented to person, place, and time.  Psychiatric:        Mood and Affect: Mood normal.        Behavior: Behavior normal.       Assessment and Plan :   PDMP not reviewed this encounter.  1. COVID-19 virus infection   2. Encounter for screening for COVID-19   3. Mild persistent asthma without complication   4. History of smoking   5. Allergic rhinitis, unspecified seasonality, unspecified trigger     Deferred imaging given clear cardiopulmonary exam, hemodynamically stable vital signs.  COVID confirmation testing pending.  I went ahead and started her upon Paxlovid as she had a positive test at home and is a high risk patient given her allergies, asthma and smoking.  We will also use supportive care. Counseled patient on potential for  adverse effects with medications prescribed/recommended today, ER and return-to-clinic precautions discussed, patient verbalized understanding.    Wallis Bamberg, [place] 04/28/22 1707

## 2022-04-29 DIAGNOSIS — F4011 Social phobia, generalized: Secondary | ICD-10-CM | POA: Diagnosis not present

## 2022-04-29 DIAGNOSIS — F330 Major depressive disorder, recurrent, mild: Secondary | ICD-10-CM | POA: Diagnosis not present

## 2022-04-29 LAB — SARS CORONAVIRUS 2 (TAT 6-24 HRS): SARS Coronavirus 2: POSITIVE — AB

## 2022-05-02 DIAGNOSIS — F4011 Social phobia, generalized: Secondary | ICD-10-CM | POA: Diagnosis not present

## 2022-05-02 DIAGNOSIS — F330 Major depressive disorder, recurrent, mild: Secondary | ICD-10-CM | POA: Diagnosis not present

## 2022-05-09 DIAGNOSIS — F330 Major depressive disorder, recurrent, mild: Secondary | ICD-10-CM | POA: Diagnosis not present

## 2022-05-09 DIAGNOSIS — F4011 Social phobia, generalized: Secondary | ICD-10-CM | POA: Diagnosis not present

## 2022-05-16 DIAGNOSIS — F4011 Social phobia, generalized: Secondary | ICD-10-CM | POA: Diagnosis not present

## 2022-05-16 DIAGNOSIS — F330 Major depressive disorder, recurrent, mild: Secondary | ICD-10-CM | POA: Diagnosis not present

## 2022-05-23 DIAGNOSIS — F4011 Social phobia, generalized: Secondary | ICD-10-CM | POA: Diagnosis not present

## 2022-05-23 DIAGNOSIS — F330 Major depressive disorder, recurrent, mild: Secondary | ICD-10-CM | POA: Diagnosis not present

## 2022-05-30 DIAGNOSIS — F330 Major depressive disorder, recurrent, mild: Secondary | ICD-10-CM | POA: Diagnosis not present

## 2022-05-30 DIAGNOSIS — F4011 Social phobia, generalized: Secondary | ICD-10-CM | POA: Diagnosis not present

## 2022-06-01 DIAGNOSIS — J029 Acute pharyngitis, unspecified: Secondary | ICD-10-CM | POA: Diagnosis not present

## 2022-06-01 DIAGNOSIS — B349 Viral infection, unspecified: Secondary | ICD-10-CM | POA: Diagnosis not present

## 2022-06-01 DIAGNOSIS — R5381 Other malaise: Secondary | ICD-10-CM | POA: Diagnosis not present

## 2022-06-06 DIAGNOSIS — F330 Major depressive disorder, recurrent, mild: Secondary | ICD-10-CM | POA: Diagnosis not present

## 2022-06-06 DIAGNOSIS — F4011 Social phobia, generalized: Secondary | ICD-10-CM | POA: Diagnosis not present

## 2022-06-13 DIAGNOSIS — F4011 Social phobia, generalized: Secondary | ICD-10-CM | POA: Diagnosis not present

## 2022-06-13 DIAGNOSIS — F330 Major depressive disorder, recurrent, mild: Secondary | ICD-10-CM | POA: Diagnosis not present

## 2022-06-14 DIAGNOSIS — L239 Allergic contact dermatitis, unspecified cause: Secondary | ICD-10-CM | POA: Diagnosis not present

## 2022-06-20 DIAGNOSIS — F4011 Social phobia, generalized: Secondary | ICD-10-CM | POA: Diagnosis not present

## 2022-06-20 DIAGNOSIS — F330 Major depressive disorder, recurrent, mild: Secondary | ICD-10-CM | POA: Diagnosis not present

## 2022-06-29 DIAGNOSIS — F330 Major depressive disorder, recurrent, mild: Secondary | ICD-10-CM | POA: Diagnosis not present

## 2022-06-29 DIAGNOSIS — F4011 Social phobia, generalized: Secondary | ICD-10-CM | POA: Diagnosis not present

## 2022-07-04 DIAGNOSIS — F4011 Social phobia, generalized: Secondary | ICD-10-CM | POA: Diagnosis not present

## 2022-07-04 DIAGNOSIS — F330 Major depressive disorder, recurrent, mild: Secondary | ICD-10-CM | POA: Diagnosis not present

## 2022-07-11 DIAGNOSIS — F4011 Social phobia, generalized: Secondary | ICD-10-CM | POA: Diagnosis not present

## 2022-07-11 DIAGNOSIS — F330 Major depressive disorder, recurrent, mild: Secondary | ICD-10-CM | POA: Diagnosis not present

## 2022-07-18 DIAGNOSIS — F330 Major depressive disorder, recurrent, mild: Secondary | ICD-10-CM | POA: Diagnosis not present

## 2022-07-18 DIAGNOSIS — F4011 Social phobia, generalized: Secondary | ICD-10-CM | POA: Diagnosis not present

## 2022-07-25 DIAGNOSIS — F330 Major depressive disorder, recurrent, mild: Secondary | ICD-10-CM | POA: Diagnosis not present

## 2022-07-25 DIAGNOSIS — F4011 Social phobia, generalized: Secondary | ICD-10-CM | POA: Diagnosis not present

## 2022-08-01 DIAGNOSIS — F330 Major depressive disorder, recurrent, mild: Secondary | ICD-10-CM | POA: Diagnosis not present

## 2022-08-01 DIAGNOSIS — F4011 Social phobia, generalized: Secondary | ICD-10-CM | POA: Diagnosis not present

## 2022-08-04 DIAGNOSIS — F332 Major depressive disorder, recurrent severe without psychotic features: Secondary | ICD-10-CM | POA: Diagnosis not present

## 2022-08-08 DIAGNOSIS — F332 Major depressive disorder, recurrent severe without psychotic features: Secondary | ICD-10-CM | POA: Diagnosis not present

## 2022-08-08 DIAGNOSIS — F330 Major depressive disorder, recurrent, mild: Secondary | ICD-10-CM | POA: Diagnosis not present

## 2022-08-08 DIAGNOSIS — F4011 Social phobia, generalized: Secondary | ICD-10-CM | POA: Diagnosis not present

## 2022-08-12 DIAGNOSIS — F332 Major depressive disorder, recurrent severe without psychotic features: Secondary | ICD-10-CM | POA: Diagnosis not present

## 2022-08-17 DIAGNOSIS — F332 Major depressive disorder, recurrent severe without psychotic features: Secondary | ICD-10-CM | POA: Diagnosis not present

## 2022-08-18 DIAGNOSIS — F330 Major depressive disorder, recurrent, mild: Secondary | ICD-10-CM | POA: Diagnosis not present

## 2022-08-18 DIAGNOSIS — F4011 Social phobia, generalized: Secondary | ICD-10-CM | POA: Diagnosis not present

## 2022-12-02 ENCOUNTER — Other Ambulatory Visit: Payer: Self-pay

## 2022-12-02 DIAGNOSIS — N760 Acute vaginitis: Secondary | ICD-10-CM

## 2022-12-02 DIAGNOSIS — R112 Nausea with vomiting, unspecified: Secondary | ICD-10-CM

## 2022-12-02 MED ORDER — ONDANSETRON HCL 4 MG PO TABS
4.0000 mg | ORAL_TABLET | Freq: Three times a day (TID) | ORAL | 1 refills | Status: DC | PRN
Start: 2022-12-02 — End: 2023-02-09

## 2022-12-02 MED ORDER — FLUCONAZOLE 150 MG PO TABS
150.0000 mg | ORAL_TABLET | Freq: Once | ORAL | 0 refills | Status: AC
Start: 2022-12-02 — End: 2022-12-02

## 2022-12-02 NOTE — Progress Notes (Signed)
Per Dr. Oscar La: Please send script in for pt for N&V and also ok to send in preventative script for yeast since has been given abxs.  Msg sent to appt desk to have them contact pt's mom Jen per DPR to schedule pt's annual exam and f/u labs from ER visit.

## 2022-12-20 ENCOUNTER — Other Ambulatory Visit: Payer: Self-pay

## 2022-12-20 DIAGNOSIS — N939 Abnormal uterine and vaginal bleeding, unspecified: Secondary | ICD-10-CM

## 2022-12-20 MED ORDER — NORETHINDRONE 0.35 MG PO TABS: 1.0000 | ORAL_TABLET | Freq: Every day | ORAL | 0 refills | Status: DC

## 2022-12-20 NOTE — Telephone Encounter (Signed)
RF request received for norethindrone 0.35mg . Last ov 03/16/22. AEX is scheduled 02/09/23. Advise if ok for refill.

## 2022-12-29 ENCOUNTER — Other Ambulatory Visit: Payer: BC Managed Care – PPO

## 2022-12-29 ENCOUNTER — Telehealth: Payer: Self-pay | Admitting: *Deleted

## 2022-12-29 DIAGNOSIS — Z114 Encounter for screening for human immunodeficiency virus [HIV]: Secondary | ICD-10-CM

## 2022-12-29 DIAGNOSIS — Z113 Encounter for screening for infections with a predominantly sexual mode of transmission: Secondary | ICD-10-CM

## 2022-12-29 DIAGNOSIS — Z1159 Encounter for screening for other viral diseases: Secondary | ICD-10-CM

## 2022-12-29 DIAGNOSIS — Z79899 Other long term (current) drug therapy: Secondary | ICD-10-CM

## 2022-12-29 NOTE — Telephone Encounter (Signed)
Patient and her mother in office for scheduled lab appt. No lab orders entered.   Orders only encounter from 12/02/22 states patient to schedule AEX and f/u labs from ER visit. No specific labs documented.   Patients mother requesting f/u LFT, patient on HIV meds due to potential exposure. Advised Dr. Oscar La is out of the office, returns on 01/03/23. Patients mother contacted Dr. Oscar La directly.   Spoke with Dr. Oscar La -labs orders placed for the following labs:  Hep C Antibody: Screening for other viral disease HIV Antibody: Screening for HIV  Hepatic Function Panel: Dx -On pre-exposure prophylaxis for HIV   Patient aware to keep AEX scheduled for 02/09/23 for f/u.   Routing to provider for final review. Patient is agreeable to disposition. Will close encounter.

## 2022-12-30 LAB — HEPATIC FUNCTION PANEL
AG Ratio: 1.8 (calc) (ref 1.0–2.5)
ALT: 15 U/L (ref 5–32)
AST: 21 U/L (ref 12–32)
Albumin: 4.5 g/dL (ref 3.6–5.1)
Alkaline phosphatase (APISO): 80 U/L (ref 36–128)
Bilirubin, Direct: 0.1 mg/dL (ref 0.0–0.2)
Globulin: 2.5 g/dL (calc) (ref 2.0–3.8)
Indirect Bilirubin: 0.5 mg/dL (calc) (ref 0.2–1.1)
Total Bilirubin: 0.6 mg/dL (ref 0.2–1.1)
Total Protein: 7 g/dL (ref 6.3–8.2)

## 2022-12-30 LAB — HEPATITIS C ANTIBODY: Hepatitis C Ab: NONREACTIVE

## 2022-12-30 LAB — HIV ANTIBODY (ROUTINE TESTING W REFLEX): HIV 1&2 Ab, 4th Generation: NONREACTIVE
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
## 2023-02-09 ENCOUNTER — Encounter: Payer: Self-pay | Admitting: Obstetrics and Gynecology

## 2023-02-09 ENCOUNTER — Ambulatory Visit (INDEPENDENT_AMBULATORY_CARE_PROVIDER_SITE_OTHER): Payer: BC Managed Care – PPO | Admitting: Obstetrics and Gynecology

## 2023-02-09 VITALS — BP 120/70 | HR 63 | Wt 127.0 lb

## 2023-02-09 DIAGNOSIS — N6325 Unspecified lump in the left breast, overlapping quadrants: Secondary | ICD-10-CM

## 2023-02-09 DIAGNOSIS — Z3041 Encounter for surveillance of contraceptive pills: Secondary | ICD-10-CM

## 2023-02-09 DIAGNOSIS — Z01419 Encounter for gynecological examination (general) (routine) without abnormal findings: Secondary | ICD-10-CM | POA: Diagnosis not present

## 2023-02-09 DIAGNOSIS — Z113 Encounter for screening for infections with a predominantly sexual mode of transmission: Secondary | ICD-10-CM

## 2023-02-09 MED ORDER — NORETHINDRONE 0.35 MG PO TABS
1.0000 | ORAL_TABLET | Freq: Every day | ORAL | 3 refills | Status: DC
Start: 2023-02-09 — End: 2024-01-03

## 2023-02-09 NOTE — Patient Instructions (Signed)

## 2023-02-10 ENCOUNTER — Telehealth: Payer: Self-pay

## 2023-02-10 DIAGNOSIS — N6325 Unspecified lump in the left breast, overlapping quadrants: Secondary | ICD-10-CM

## 2023-02-10 NOTE — Telephone Encounter (Signed)
-----   Message from Romualdo Bolk, MD sent at 02/09/2023  4:37 PM EDT ----- Please schedule her for a left breast u/s, lump at 6 o'clock. Thanks! Noreene Larsson

## 2023-02-10 NOTE — Telephone Encounter (Signed)
Savannah @ [facility] got pt scheduled for 7/2 @ 1230pm.   Pt notified via mychart msg.   Will route to provider for final review and close.

## 2023-02-21 ENCOUNTER — Ambulatory Visit
Admission: RE | Admit: 2023-02-21 | Discharge: 2023-02-21 | Disposition: A | Discharge status: Home / Self Care | Payer: BC Managed Care – PPO | Source: Ambulatory Visit | Attending: Obstetrics and Gynecology | Admitting: Obstetrics and Gynecology

## 2023-02-21 DIAGNOSIS — N6325 Unspecified lump in the left breast, overlapping quadrants: Secondary | ICD-10-CM

## 2023-03-09 NOTE — Telephone Encounter (Signed)
Mychart msg returned unread. However, pt made it to appt. Results in chart to review.
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
## 2024-01-03 ENCOUNTER — Ambulatory Visit: Admitting: Obstetrics and Gynecology

## 2024-01-03 ENCOUNTER — Encounter: Payer: Self-pay | Admitting: Obstetrics and Gynecology

## 2024-01-03 VITALS — BP 112/78 | HR 80

## 2024-01-03 DIAGNOSIS — N9089 Other specified noninflammatory disorders of vulva and perineum: Secondary | ICD-10-CM

## 2024-01-03 DIAGNOSIS — Z3041 Encounter for surveillance of contraceptive pills: Secondary | ICD-10-CM

## 2024-01-03 MED ORDER — NORETHINDRONE 0.35 MG PO TABS: 1.0000 | ORAL_TABLET | Freq: Every day | ORAL | 1 refills | Status: DC

## 2024-01-03 NOTE — Patient Instructions (Signed)
Human Papillomavirus (HPV) Vaccine Injection What is this medication? HUMAN PAPILLOMAVIRUS VACCINE (HYOO muhn pap uh LOH muh vahy ruhs vak SEEN) reduces the risk of human papillomavirus (HPV). It does not treat HPV. It is still possible to get HPV after receiving this vaccine, but the symptoms may be less severe or not last as long. It works by helping your immune system learn how to fight off a future infection. This medicine may be used for other purposes; ask your health care provider or pharmacist if you have questions. COMMON BRAND NAME(S): Gardasil 9 What should I tell my care team before I take this medication? They need to know if you have any of these conditions: Fever Hemophilia HIV or AIDS Immune system problems Infection Low platelets An unusual reaction to human papillomavirus vaccine, yeast, other vaccines, other medications, foods, dyes, or preservatives Pregnant or trying to get pregnant Breastfeeding How should I use this medication? This vaccine is injected into a muscle. It is given by your care team. This vaccine requires 2 or 3 doses to get the full benefit. Set a reminder for when your next dose is due. A copy of the Vaccine Information Statement will be given before each vaccination. Be sure to read this information carefully each time. This sheet may change often. Talk to your care team about the use of this medication in children. While it may be prescribed for children as young as 9 years for selected conditions, precautions do apply. Overdosage: If you think you have taken too much of this medicine contact a poison control center or emergency room at once. NOTE: This medicine is only for you. Do not share this medicine with others. What if I miss a dose? Keep appointments for follow-up doses as directed. It is important not to miss your dose. Call your care team if you are unable to keep an appointment. What may interact with this medication? Certain medications  for arthritis Medications for organ transplant Medications to treat cancer Steroid medications, such as prednisone or cortisone This list may not describe all possible interactions. Give your health care provider a list of all the medicines, herbs, non-prescription drugs, or dietary supplements you use. Also tell them if you smoke, drink alcohol, or use illegal drugs. Some items may interact with your medicine. What should I watch for while using this medication? Visit your care team regularly. Report any side effects to your care team right away. This vaccine, like all vaccines, may not fully protect everyone. What side effects may I notice from receiving this medication? Side effects that you should report to your care team as soon as possible: Allergic reactions--skin rash, itching, hives, swelling of the face, lips, tongue, or throat Feeling faint or lightheaded Side effects that usually do not require medical attention (report these to your care team if they continue or are bothersome): Diarrhea Dizziness Fatigue Fever Headache Nausea Pain, redness, irritation, or bruising at the injection site This list may not describe all possible side effects. Call your doctor for medical advice about side effects. You may report side effects to FDA at 1-800-FDA-1088. Where should I keep my medication? This vaccine is only given by your care team. It will not be stored at home. NOTE: This sheet is a summary. It may not cover all possible information. If you have questions about this medicine, talk to your doctor, pharmacist, or health care provider.  2024 Elsevier/Gold Standard (2022-01-19 00:00:00)

## 2024-01-29 ENCOUNTER — Encounter: Payer: Self-pay | Admitting: Obstetrics and Gynecology

## 2024-01-29 ENCOUNTER — Telehealth: Payer: Self-pay | Admitting: Obstetrics and Gynecology

## 2024-01-29 NOTE — Telephone Encounter (Signed)
 Phone call returned to the patient's mother Margo Shells, per My Chart request.   Patient is experiencing heavy menstrual bleeding since the onset of her periods, and is currently in [place] until the end of this month.  I shared that I have sent [name] a My Chart message recommending that she double up on her pills for 5 days to control heavy (or prolonged) bleeding.   Mom wonders if patient could have endometriosis.   We talked about blood work to evaluate for blood clotting disorders such as VonWillibrand's disease and potential treatment options for pain and bleeding:  Kyleena IUD, Depo Provera, Orilissa, surgical care.    Lots of options available to explore.   Patient has an appointment with me July 1.

## 2024-01-29 NOTE — Telephone Encounter (Signed)
 [name],   Can you let your mother know that I got in touch with you through My Chart?  Jonel Nephew, MD

## 2024-01-29 NOTE — Telephone Encounter (Signed)
 Patients mother also called and left message on triage line requesting call back from Dr. Colvin Dec.   Spoke with patients mother, Jen, ok per dpr. Advised mychart message also receive from [name], awaiting additional information. Mom is requesting to schedule OV with Dr. Colvin Dec when she returns from [place] 02/17/24. OV scheduled for 02/20/24 at 1130.  Advised I will forward message to Dr. Colvin Dec for review, awaiting additional information. Mom states limited healthcare options in [place], is aware to seek immediate care if bleeding is heavy or pain severe.   Routing to Dr. Colvin Dec.

## 2024-01-30 NOTE — Telephone Encounter (Signed)
 Dr. Colvin Dec spoke to patients mother. Just holding to make sure patient saw mychart message

## 2024-02-19 NOTE — Progress Notes (Unsigned)
 GYNECOLOGY  VISIT   HPI: 23 y.o.   Single  Caucasian female   [id number] with Patient's last menstrual period was 01/19/2024 (approximate).   here for: Irregular periods, had some pain last week thinks it may have been an ovarian cyst rupturing.    Patient is on progesterone only birth control pills.  She has a history of heavy menstrual bleeding and dysmenorrhea.   Just returned from a month in [place].  Had bleeding for a couple of weeks prior to going to [place].  Bleeding stopped temporarily, and then restarted again along with cramping.  She did go to a clinic and had evaluation.  No US  performed.  She doubled up on her norethindrone  birth control for 2.5 weeks and the bleeding and cramping stopped.  No bleeding today.   No missed pills with the bleeding and cramping she experienced.   Hx 2 expelled/expelling Mirena  IUDs. Took combined oral birth control in the past, and she stopped these due to worsening of cramping.  The progesterone only pills have worked the best to control her cramping.   Not sexually active for 2 yrs.  Currently having some loose stoosl.   Living in [place].   GYNECOLOGIC HISTORY: Patient's last menstrual period was 01/19/2024 (approximate). 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   Environmental allergies 03/02/2012    Past Medical History:   Diagnosis Date   ADD (attention deficit disorder)    ADHD    Allergy    Phreesia 08/08/2020   Anxiety    Phreesia 08/08/2020   Asthma    Asthma    Phreesia 08/08/2020   Asthma, mild intermittent    Depression    Phreesia 08/08/2020   Low back pain 09/28/2017   Pain of great toe, right 05/18/2017   Early valgus shift Normal great toe motion   Patellofemoral syndrome of right knee 06/26/2018    Past Surgical History:  Procedure Laterality Date   INTRAUTERINE DEVICE (IUD) INSERTION     mirena    OTOPLASTY     WISDOM TOOTH EXTRACTION     age 25 yo    Current Outpatient Medications  Medication Sig Dispense Refill   albuterol  (VENTOLIN  HFA) 108 (90 Base) MCG/ACT inhaler Inhale 1-2 puffs into the lungs every 6 (six) hours as needed for wheezing or shortness of breath. 18 g 0   hydrocortisone 2.5%-nystatin-zinc oxide 20% 1:1:1 ointment mixture Apply topically.     hydrOXYzine  (ATARAX /VISTARIL ) 10 MG tablet Take 1 tablet (10 mg total) by mouth 3 (three) times daily as needed. 10 tablet 0   loratadine (CLARITIN) 10 MG  tablet Take 10 mg by mouth.     methylphenidate (RITALIN) 10 MG tablet Take 10 mg by mouth 2 (two) times daily.     norethindrone  (MICRONOR ) 0.35 MG tablet Take 1 tablet (0.35 mg total) by mouth daily. 84 tablet 1   tacrolimus  (PROTOPIC ) 0.1 % ointment Apply topically 2 (two) times daily. 100 g 0   triamcinolone cream (KENALOG) 0.1 % Apply 1 Application topically 2 (two) times daily.     No current facility-administered medications for this visit.     ALLERGIES: Peanut-containing drug products, Benzalkonium chloride, Molds & smuts, Pollen extract, and Propolis  Family History  Problem Relation Age of Onset   Spondylolisthesis Mother    Other Mother        retinal tears   Food Allergy Mother    Environmental Allergies Mother    Asthma Mother    Depression Father        lexapro  very helpful   Hyperlipidemia Father    Arthritis Father        s/p hip replacement    Bipolar disorder Maternal Aunt        Died of COVID   Migraines Paternal Aunt    Depression Paternal Aunt    Anxiety disorder Paternal Aunt    Colitis Maternal Grandmother    Arthritis Maternal Grandmother    Diabetes Maternal Grandfather    Heart disease Maternal Grandfather    Arthritis Maternal Grandfather    Fibromyalgia Paternal Grandmother    Rheum arthritis Paternal Grandmother    Depression Paternal Grandfather    Migraines Cousin    ADD / ADHD Cousin    Autism Cousin    Seizures Neg Hx    Schizophrenia Neg Hx     Social History   Socioeconomic History   Marital status: Single    Spouse name: Not on file   Number of children: Not on file   Years of education: Not on file   Highest education level: Not on file  Occupational History   Not on file  Tobacco Use   Smoking status: Some Days    Types: Cigarettes    Passive exposure: Never   Smokeless tobacco: Never  Substance and Sexual Activity   Alcohol use: Yes    Comment: occ   Drug use: Yes    Comment: legal ketamine   Sexual activity: Not Currently    Partners: Male    Birth control/protection: OCP  Other Topics Concern   Not on file  Social History Narrative   Jackelyn is 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 She lives with both parents. She enjoys playing violin, playing volleyball, and hanging out with friends.    Social Drivers of Corporate investment banker Strain: Not on file  Food Insecurity: No Food Insecurity (02/17/2022)   Received from [facility]   Hunger Vital Sign    Within the past 12 months, you worried that your food would run out before you got the money to buy more.: Never true    Within the past 12 months, the food you bought just didn't last and you didn't have money to get more.: Never true  Transportation Needs: Not on file  Physical Activity: Not on file  Stress: Not on file  Social Connections: Unknown (01/20/2022)   Received from [facility]    Social Network    Social Network: Not on file  Intimate Partner Violence: Unknown (01/20/2022)   Received from [facility]   HITS    Physically Hurt: Not on file    Insult or Talk Down To: Not on file    Threaten Physical Harm: Not on file    Scream or Curse: Not on file    Review of Systems  All other systems reviewed and are negative.   PHYSICAL EXAMINATION:   BP 120/84 (BP Location: Left Arm, Patient Position: Sitting)   Pulse 92   LMP 01/19/2024 (Approximate)   SpO2 97%     General appearance: alert, cooperative and appears stated age   ASSESSMENT:  Menorrhagia with irregular menses.  Resolved.  On progesterone only birth control pills.  Hx pelvic pain.  PLAN:  Patient and I agree that pelvic exam is not needed today.  Will do Vonwillibrand's testing and coagulation studies to rule out coagulopathy.  Tx options reviewed:  Micronor , Depo Provera, Nexplanon, [place], surgical care.  She will continue Micronor .  She will consider Nexplanon or Depo Provera.  Return for pelvic US  to look for signs of ovarian cyst formation or endometriosis.   37 min  total time was spent for this patient encounter, including preparation, face-to-face counseling with the patient, coordination of care, and documentation of the encounter.

## 2024-02-20 ENCOUNTER — Encounter: Payer: Self-pay | Admitting: Obstetrics and Gynecology

## 2024-02-20 ENCOUNTER — Ambulatory Visit: Admitting: Obstetrics and Gynecology

## 2024-02-20 VITALS — BP 120/84 | HR 92

## 2024-02-20 DIAGNOSIS — N921 Excessive and frequent menstruation with irregular cycle: Secondary | ICD-10-CM | POA: Diagnosis not present

## 2024-02-20 DIAGNOSIS — R102 Pelvic and perineal pain: Secondary | ICD-10-CM

## 2024-02-20 LAB — CBC
HCT: 44.8 % (ref 35.0–45.0)
Hemoglobin: 14.7 g/dL (ref 11.7–15.5)
MCH: 30.9 pg (ref 27.0–33.0)
MCHC: 32.8 g/dL (ref 32.0–36.0)
MCV: 94.3 fL (ref 80.0–100.0)
MPV: 9.9 fL (ref 7.5–12.5)
Platelets: 227 10*3/uL (ref 140–400)
RBC: 4.75 10*6/uL (ref 3.80–5.10)
RDW: 12.3 % (ref 11.0–15.0)
WBC: 7.1 10*3/uL (ref 3.8–10.8)

## 2024-02-20 NOTE — Patient Instructions (Signed)
 Medroxyprogesterone Injection (Contraception) What is this medication? MEDROXYPROGESTERONE (me DROX ee proe JES te rone) prevents ovulation and pregnancy. It belongs to a group of medications called contraceptives. This medication is a progestin hormone. This medicine may be used for other purposes; ask your health care provider or pharmacist if you have questions. COMMON BRAND NAME(S): Depo-Provera, Depo-subQ Provera 104 What should I tell my care team before I take this medication? They need to know if you have any of these conditions: Asthma Blood clots Breast cancer or family history of breast cancer Depression Diabetes Eating disorder (anorexia nervosa) Frequently drink alcohol Heart attack High blood pressure HIV infection or AIDS Kidney disease Liver disease Migraine headaches Osteoporosis, weak bones Seizures Stroke Tobacco use Vaginal bleeding An unusual or allergic reaction to medroxyprogesterone, other medications, foods, dyes, or preservatives Pregnant or trying to get pregnant Breast-feeding How should I use this medication? Depo-Provera CI contraceptive injection is given into a muscle. Depo-subQ Provera 104 injection is given under the skin. It is given in a hospital or clinic setting. The injection is usually given during the first 5 days after the start of a menstrual period or 6 weeks after delivery of a baby. A patient package insert for the product will be given with each prescription and refill. Be sure to read this information carefully each time. The sheet may change often. Talk to your care team about the use of this medication in children. Special care may be needed. These injections have been used in female children who have started having menstrual periods. Overdosage: If you think you have taken too much of this medicine contact a poison control center or emergency room at once. NOTE: This medicine is only for you. Do not share this medicine with  others. What if I miss a dose? Keep appointments for follow-up doses. You must get an injection once every 3 months. It is important not to miss your dose. Call your care team if you are unable to keep an appointment. What may interact with this medication? Antibiotics or medications for infections, especially rifampin and griseofulvin Antivirals for HIV or hepatitis Aprepitant Armodafinil Bexarotene Bosentan Medications for seizures, such as carbamazepine, felbamate, oxcarbazepine, phenytoin, phenobarbital, primidone, topiramate Mitotane Modafinil St. John's Wort This list may not describe all possible interactions. Give your health care provider a list of all the medicines, herbs, non-prescription drugs, or dietary supplements you use. Also tell them if you smoke, drink alcohol, or use illegal drugs. Some items may interact with your medicine. What should I watch for while using this medication? This medication does not protect you against HIV infection (AIDS) or other sexually transmitted diseases. Use of this product may cause you to lose calcium from your bones. Loss of calcium may cause weak bones (osteoporosis). Only use this product for more than 2 years if other forms of birth control are not right for you. The longer you use this product for birth control the more likely you will be at risk for weak bones. Ask your care team how you can keep strong bones. You may have a change in bleeding pattern or irregular periods. Many females stop having periods while taking this medication. If you have received your injections on time, your chance of being pregnant is very low. If you think you may be pregnant, see your care team as soon as possible. Tell your care team if you want to get pregnant within the next year. The effect of this medication may last a long time  after you get your last injection. What side effects may I notice from receiving this medication? Side effects that you should  report to your care team as soon as possible: Allergic reactions--skin rash, itching, hives, swelling of the face, lips, tongue, or throat Blood clot--pain, swelling, or warmth in the leg, shortness of breath, chest pain Gallbladder problems--severe stomach pain, nausea, vomiting, fever Increase in blood pressure Liver injury--right upper belly pain, loss of appetite, nausea, light-colored stool, dark yellow or brown urine, yellowing skin or eyes, unusual weakness or fatigue New or worsening migraines or headaches Seizures Stroke--sudden numbness or weakness of the face, arm, or leg, trouble speaking, confusion, trouble walking, loss of balance or coordination, dizziness, severe headache, change in vision Unusual vaginal discharge, itching, or odor Worsening mood, feelings of depression Side effects that usually do not require medical attention (report to your care team if they continue or are bothersome): Breast pain or tenderness Dark patches of the skin on the face or other sun-exposed areas Irregular menstrual cycles or spotting Nausea Weight gain This list may not describe all possible side effects. Call your doctor for medical advice about side effects. You may report side effects to FDA at 1-800-FDA-1088. Where should I keep my medication? This injection is only given by a care team. It will not be stored at home. NOTE: This sheet is a summary. It may not cover all possible information. If you have questions about this medicine, talk to your doctor, pharmacist, or health care provider.  2024 Elsevier/Gold Standard (2021-04-21 00:00:00)  Etonogestrel Implant What is this medication? ETONOGESTREL (et oh noe JES trel) prevents ovulation and pregnancy. It belongs to a group of medications called contraceptives. This medication is a progestin hormone. This medicine may be used for other purposes; ask your health care provider or pharmacist if you have questions. COMMON BRAND NAME(S):  Implanon, Nexplanon What should I tell my care team before I take this medication? They need to know if you have any of these conditions: Abnormal vaginal bleeding Blood clots Blood vessel disease Breast, cervical, endometrial, ovarian, liver, or uterine cancer Diabetes Gallbladder disease Heart disease or recent heart attack High blood pressure High cholesterol or triglycerides Kidney disease Liver disease Migraine headaches Seizures Stroke Tobacco use An unusual or allergic reaction to etonogestrel, other medications, foods, dyes, or preservatives Pregnant or trying to get pregnant Breastfeeding How should I use this medication? This device is inserted just under the skin on the inner side of your upper arm by your care team. Talk to your care team about the use of this medication in children. Special care may be needed. Overdosage: If you think you have taken too much of this medicine contact a poison control center or emergency room at once. NOTE: This medicine is only for you. Do not share this medicine with others. What if I miss a dose? This does not apply. What may interact with this medication? Do not take this medication with any of the following: Amprenavir Fosamprenavir This medication may also interact with the following: Acitretin Aprepitant Armodafinil Bexarotene Bosentan Carbamazepine Certain antivirals for HIV or hepatitis Certain medications for fungal infections, such as fluconazole , ketoconazole, itraconazole, or voriconazole Cyclosporine Felbamate Griseofulvin Lamotrigine Modafinil Oxcarbazepine Phenobarbital Phenytoin Primidone Rifabutin Rifampin Rifapentine St. John's wort Topiramate This list may not describe all possible interactions. Give your health care provider a list of all the medicines, herbs, non-prescription drugs, or dietary supplements you use. Also tell them if you smoke, drink alcohol, or use illegal  drugs. Some items may  interact with your medicine. What should I watch for while using this medication? Visit your care team for regular checks on your progress. Using this medication does not protect you or your partner against HIV or other sexually transmitted infections (STIs). You should be able to feel the implant by pressing your fingertips over the skin where it was inserted. Contact your care team if you cannot feel the implant, and use a non-hormonal birth control method (such as condoms) until your care team confirms that the implant is in place. Contact your care team if you think that the implant may have broken or become bent while in your arm. You will receive a user card from your care team after the implant is inserted. The card is a record of the location of the implant in your upper arm and when it should be removed. Keep this card with your health records. What side effects may I notice from receiving this medication? Side effects that you should report to your care team as soon as possible: Allergic reactions--skin rash, itching, hives, swelling of the face, lips, tongue, or throat Blood clot--pain, swelling, or warmth in the leg, shortness of breath, chest pain Gallbladder problems--severe stomach pain, nausea, vomiting, fever Increase in blood pressure Liver injury--right upper belly pain, loss of appetite, nausea, light-colored stool, dark yellow or brown urine, yellowing skin or eyes, unusual weakness or fatigue New or worsening migraines or headaches Pain, redness, or irritation at injection site Stroke--sudden numbness or weakness of the face, arm, or leg, trouble speaking, confusion, trouble walking, loss of balance or coordination, dizziness, severe headache, change in vision Unusual vaginal discharge, itching, or odor Worsening mood, feelings of depression Side effects that usually do not require medical attention (report to your care team if they continue or are bothersome): Breast pain or  tenderness Dark patches of skin on the face or other sun-exposed areas Irregular menstrual cycles or spotting Nausea Weight gain This list may not describe all possible side effects. Call your doctor for medical advice about side effects. You may report side effects to FDA at 1-800-FDA-1088. Where should I keep my medication? This medication is given in a hospital or clinic and will not be stored at home. NOTE: This sheet is a summary. It may not cover all possible information. If you have questions about this medicine, talk to your doctor, pharmacist, or health care provider.  2024 Elsevier/Gold Standard (2022-03-15 00:00:00)

## 2024-02-21 LAB — PROTIME-INR
INR: 1
Prothrombin Time: 10.4 s (ref 9.0–11.5)

## 2024-02-21 LAB — APTT: aPTT: 31 s (ref 23–32)

## 2024-02-22 ENCOUNTER — Ambulatory Visit: Payer: Self-pay | Admitting: Obstetrics and Gynecology

## 2024-02-27 LAB — VON WILLEBRAND PANEL
Factor-VIII Activity: 45 %{normal} — ABNORMAL LOW (ref 50–180)
Ristocetin Co-Factor: 45 %{normal} (ref 42–200)
Von Willebrand Antigen, Plasma: 49 % — ABNORMAL LOW (ref 50–217)
aPTT: 30 s (ref 23–32)

## 2024-03-12 ENCOUNTER — Other Ambulatory Visit: Payer: Self-pay

## 2024-03-12 DIAGNOSIS — R791 Abnormal coagulation profile: Secondary | ICD-10-CM

## 2024-04-10 ENCOUNTER — Ambulatory Visit

## 2024-04-10 ENCOUNTER — Other Ambulatory Visit: Admitting: Obstetrics and Gynecology

## 2024-04-10 ENCOUNTER — Telehealth: Payer: Self-pay | Admitting: Obstetrics and Gynecology

## 2024-04-10 DIAGNOSIS — N921 Excessive and frequent menstruation with irregular cycle: Secondary | ICD-10-CM

## 2024-04-10 NOTE — Telephone Encounter (Signed)
 Please contact patient to schedule pelvic ultrasound with [facility] potentially for 04/19/24.   She arrived late for her ultrasound appointment today in the office, and the appointment was cancelled due to late arrival.  Unfortunately she traveled from [place] for this appointment.   She has an appointment with medical oncology in [place] on 04/19/24 at 9:00 am followed by lab visits.   I will be out of the office on this date, but she can still proceed with this ultrasound evaluation.

## 2024-04-10 NOTE — Progress Notes (Deleted)
 GYNECOLOGY  VISIT   HPI: 23 y.o.   Single  Caucasian female   G0P0000 with No LMP recorded.   here for: US  Consult      GYNECOLOGIC HISTORY: No LMP recorded. 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) 09/24/2020   Poor sleep hygiene 09/24/2020   Weight loss 09/24/2020   Lactose intolerance 08/27/2020   Peanut allergy 08/11/2020   Eczema 08/11/2020   Advanced sleep phase syndrome 03/28/2018   Environmental allergies 03/02/2012    Past Medical History:  Diagnosis Date   ADD (attention deficit disorder)    ADHD    Allergy    Phreesia 08/08/2020   Anxiety    Phreesia 08/08/2020   Asthma    Asthma    Phreesia 08/08/2020   Asthma, mild intermittent    Depression    Phreesia 08/08/2020   Low back pain 09/28/2017   Pain of great toe, right 05/18/2017   Early valgus shift Normal great toe motion   Patellofemoral syndrome of right knee 06/26/2018    Past Surgical History:  Procedure Laterality Date   INTRAUTERINE DEVICE (IUD) INSERTION     mirena    OTOPLASTY     WISDOM TOOTH EXTRACTION     age 43 yo    Current Outpatient Medications  Medication Sig Dispense Refill   albuterol  (VENTOLIN  HFA) 108 (90 Base) MCG/ACT inhaler Inhale 1-2 puffs into the lungs every 6 (six) hours as needed for wheezing or shortness of breath. 18 g 0   hydrocortisone 2.5%-nystatin-zinc oxide 20% 1:1:1 ointment mixture Apply topically.     hydrOXYzine  (ATARAX /VISTARIL ) 10 MG tablet Take 1 tablet (10 mg total) by mouth 3 (three) times daily as needed. 10 tablet 0   loratadine (CLARITIN) 10 MG  tablet Take 10 mg by mouth.     methylphenidate (RITALIN) 10 MG tablet Take 10 mg by mouth 2 (two) times daily.     norethindrone  (MICRONOR ) 0.35 MG tablet Take 1 tablet (0.35 mg total) by mouth daily. 84 tablet 1   tacrolimus  (PROTOPIC ) 0.1 % ointment Apply topically 2 (two) times daily. 100 g 0   triamcinolone cream (KENALOG) 0.1 % Apply 1 Application topically 2 (two) times daily.     No current facility-administered medications for this visit.     ALLERGIES: Peanut-containing drug products, Benzalkonium chloride, Molds & smuts, Pollen extract, and Propolis  Family History  Problem Relation Age of Onset   Spondylolisthesis Mother    Other Mother        retinal tears   Food Allergy Mother    Environmental Allergies Mother    Asthma Mother    Depression Father        lexapro  very helpful   Hyperlipidemia Father    Arthritis Father  s/p hip replacement   Bipolar disorder Maternal Aunt        Died of COVID   Migraines Paternal Aunt    Depression Paternal Aunt    Anxiety disorder Paternal Aunt    Colitis Maternal Grandmother    Arthritis Maternal Grandmother    Diabetes Maternal Grandfather    Heart disease Maternal Grandfather    Arthritis Maternal Grandfather    Fibromyalgia Paternal Grandmother    Rheum arthritis Paternal Grandmother    Depression Paternal Grandfather    Migraines Cousin    ADD / ADHD Cousin    Autism Cousin    Seizures Neg Hx    Schizophrenia Neg Hx     Social History   Socioeconomic History   Marital status: Single    Spouse name: Not on file   Number of children: Not on file   Years of education: Not on file   Highest education level: Not on file  Occupational History   Not on file  Tobacco Use   Smoking status: Some Days    Types: Cigarettes    Passive exposure: Never   Smokeless tobacco: Never  Substance and Sexual Activity   Alcohol use: Yes    Comment: occ   Drug use: Yes    Comment: legal ketamine   Sexual activity: Not  Currently    Partners: Male    Birth control/protection: OCP  Other Topics Concern   Not on file  Social History Narrative   Jackelyn is 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 She lives with both parents. She enjoys playing violin, playing volleyball, and hanging out with friends.    Social Drivers of Corporate investment banker Strain: Not on file  Food Insecurity: No Food Insecurity (02/17/2022)   Received from [facility]   Hunger Vital Sign    Within the past 12 months, you worried that your food would run out before you got the money to buy more.: Never true    Within the past 12 months, the food you bought just didn't last and you didn't have money to get more.: Never true  Transportation Needs: Not on file  Physical Activity: Not on file  Stress: Not on file  Social Connections: Unknown (01/20/2022)   Received from [facility]   Social Network    Social Network: Not on file  Intimate Partner Violence: Unknown (01/20/2022)   Received from [facility]   HITS    Physically Hurt: Not on file    Insult or Talk Down To: Not on file    Threaten Physical Harm: Not on file    Scream or Curse: Not on file    Review of Systems  PHYSICAL EXAMINATION:   There were no vitals taken for this visit.    General appearance: alert, cooperative and appears stated age Head: Normocephalic, without obvious abnormality, atraumatic Neck: no adenopathy, supple, symmetrical, trachea midline and thyroid normal to inspection and palpation Lungs: clear to auscultation bilaterally Breasts: normal appearance, no masses or tenderness, No nipple retraction or dimpling, No nipple discharge or bleeding, No axillary or supraclavicular adenopathy Heart: regular rate and rhythm Abdomen: soft, non-tender, no masses,  no organomegaly Extremities: extremities normal, atraumatic, no cyanosis or edema Skin: Skin color, texture, turgor normal. No rashes or lesions Lymph nodes:  Cervical, supraclavicular, and axillary nodes normal. No abnormal inguinal nodes palpated Neurologic: Grossly normal  Pelvic: External genitalia:  no lesions  Urethra:  normal appearing urethra with no masses, tenderness or lesions              Bartholins and Skenes: normal                 Vagina: normal appearing vagina with normal color and discharge, no lesions              Cervix: no lesions                Bimanual Exam:  Uterus:  normal size, contour, position, consistency, mobility, non-tender              Adnexa: no mass, fullness, tenderness              Rectal exam: {yes no:314532}.  Confirms.              Anus:  normal sphincter tone, no lesions  Chaperone was present for exam:  {BSCHAPERONE:31226::Emily F, CMA}  ASSESSMENT:    PLAN:    {LABS (Optional):23779}  ***  total time was spent for this patient encounter, including preparation, face-to-face counseling with the patient, coordination of care, and documentation of the encounter.

## 2024-04-11 NOTE — Telephone Encounter (Signed)
 Spoke with Hadassah at [employer].  Patient scheduled for PUS at [facility], located at [address] on 04/19/24 at 1240.  Arrive with full bladder. Drink 32 oz of water 1 hr prior.   Patient notified, patient verbalizes understanding and is agreeable.   Routing to provider for final review. Patient is agreeable to disposition. Will close encounter.

## 2024-04-18 NOTE — Progress Notes (Signed)
 [facility] [phone]   [phone]  INITIAL CONSULT NOTE  Patient Care Team: Pcp, No as PCP - General  Hematological/Oncological History # Low Von Willebrand/Factor VIII levels  02/20/2024: Factor VIII activity 45% (nml 50-180%), von Willebrand Antigen 49% (nml 50-217%)  04/19/2024: establish care with Dr. Federico   CHIEF COMPLAINTS/PURPOSE OF CONSULTATION:  Low Von Willebrand/Factor VIII levels    HISTORY OF PRESENTING ILLNESS:  Barbara Mcmillan 23 y.o. female with medical history significant for depression who presents for evaluation of mildly low von Willebrand and Factor VII levels.   On review of the previous records the patient underwent evaluation by her OB/GYN provider for heavy menstrual bleeding.  She underwent von Willebrand's testing which revealed Factor VIII activity 45% (nml 50-180%), von Willebrand Antigen 49% (nml 50-217%).  Due to concern for these findings the patient was referred to hematology for further evaluation and management.  On exam today Mrs. Dragos [facility] reports that she has been having heavy menstrual cycles and cycles that are persistently long.  She reports that she has irregular cycles and she is been spotting for the last 2 weeks.  She reports on her heaviest day she can go through a pad every 1-2 hours.  They are soaked.  She reports that she does have some occasional nosebleeds but does not have any easy bruising.  She reports that she has not had issues with her iron before.  She notes that her energy today is about a 6 out of 10.  She does eat red meat but not very often.  She prefers chicken.  On further discussion she reports her family history is remarkable for eye and back issues in her mother as well as a hysterectomy due to ovarian cysts.  She reports that her father had problems with his hip and ADHD.  She reports she is an only child.  Her maternal grandmother had dementia and her maternal grandfather had  lung cancer.  She reports that she does smoke an occasional cigarette and did vape from ages 60 up to 64.  She subsequently quit.  She is currently taking prescribed ketamine and low doses.  She notes drinks alcohol approximate 2 or 3 times per week.  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  She is attending school in [place] as well studying criminal justice and Spanish.  She otherwise denies any fevers, chills, sweats, nausea, vomiting or diarrhea.  A full 10 point ROS is otherwise negative.  MEDICAL HISTORY:  Past Medical History:  Diagnosis Date   ADD (attention deficit disorder)    ADHD    Allergy    Phreesia 08/08/2020   Anxiety    Phreesia 08/08/2020   Asthma    Asthma    Phreesia 08/08/2020   Asthma, mild intermittent    Depression    Phreesia 08/08/2020   Low back pain 09/28/2017   Pain of great toe, right 05/18/2017   Early valgus shift Normal great toe motion   Patellofemoral syndrome of right knee 06/26/2018    SURGICAL HISTORY: Past Surgical History:  Procedure Laterality Date   INTRAUTERINE DEVICE (IUD) INSERTION     mirena    OTOPLASTY     WISDOM TOOTH EXTRACTION     age 27 yo    SOCIAL HISTORY: Social History   Socioeconomic History   Marital status: Single    Spouse name: Not on file   Number of children: Not on file  Years of education: Not on file   Highest education level: Not on file  Occupational History   Not on file  Tobacco Use   Smoking status: Some Days    Types: Cigarettes    Passive exposure: Never   Smokeless tobacco: Never  Substance and Sexual Activity   Alcohol use: Yes    Comment: occ   Drug use: Yes    Comment: legal ketamine   Sexual activity: Not Currently    Partners: Male    Birth control/protection: OCP  Other Topics Concern   Not on file  Social History Narrative   Jackelyn is 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 She lives with both parents. She  enjoys playing violin, playing volleyball, and hanging out with friends.    Social Drivers of Corporate investment banker Strain: Not on file  Food Insecurity: No Food Insecurity (04/19/2024)   Hunger Vital Sign    Worried About Running Out of Food in the Last Year: Never true    Ran Out of Food in the Last Year: Never true  Transportation Needs: No Transportation Needs (04/19/2024)   PRAPARE - Administrator, Civil Service (Medical): No    Lack of Transportation (Non-Medical): No  Physical Activity: Not on file  Stress: Not on file  Social Connections: Unknown (01/20/2022)   Received from [facility]   Social Network    Social Network: Not on file  Intimate Partner Violence: Not At Risk (04/19/2024)   Humiliation, Afraid, Rape, and Kick questionnaire    Fear of Current or Ex-Partner: No    Emotionally Abused: No    Physically Abused: No    Sexually Abused: No    FAMILY HISTORY: Family History  Problem Relation Age of Onset   Spondylolisthesis Mother    Other Mother        retinal tears   Food Allergy Mother    Environmental Allergies Mother    Asthma Mother    Depression Father        lexapro  very helpful   Hyperlipidemia Father    Arthritis Father        s/p hip replacement   Bipolar disorder Maternal Aunt        Died of COVID   Migraines Paternal Aunt    Depression Paternal Aunt    Anxiety disorder Paternal Aunt    Colitis Maternal Grandmother    Arthritis Maternal Grandmother    Diabetes Maternal Grandfather    Heart disease Maternal Grandfather    Arthritis Maternal Grandfather    Fibromyalgia Paternal Grandmother    Rheum arthritis Paternal Grandmother    Depression Paternal Grandfather    Migraines Cousin    ADD / ADHD Cousin    Autism Cousin    Seizures Neg Hx    Schizophrenia Neg Hx     ALLERGIES:  is allergic to peanut-containing drug products, benzalkonium chloride, molds & smuts, pollen extract, and propolis.  MEDICATIONS:  Current  Outpatient Medications  Medication Sig Dispense Refill   KETAMINE HCL SL Place under the tongue as needed.     albuterol  (VENTOLIN  HFA) 108 (90 Base) MCG/ACT inhaler Inhale 1-2 puffs into the lungs every 6 (six) hours as needed for wheezing or shortness of breath. 18 g 0   hydrocortisone 2.5%-nystatin-zinc oxide 20% 1:1:1 ointment mixture Apply topically.     hydrOXYzine  (ATARAX /VISTARIL ) 10 MG tablet Take 1 tablet (10 mg total) by mouth 3 (three) times daily  as needed. 10 tablet 0   loratadine (CLARITIN) 10 MG tablet Take 10 mg by mouth.     methylphenidate (RITALIN) 10 MG tablet Take 10 mg by mouth 2 (two) times daily.     norethindrone  (MICRONOR ) 0.35 MG tablet Take 1 tablet (0.35 mg total) by mouth daily. 84 tablet 1   tacrolimus  (PROTOPIC ) 0.1 % ointment Apply topically 2 (two) times daily. 100 g 0   triamcinolone cream (KENALOG) 0.1 % Apply 1 Application topically 2 (two) times daily.     No current facility-administered medications for this visit.    REVIEW OF SYSTEMS:   Constitutional: ( - ) fevers, ( - )  chills , ( - ) night sweats Eyes: ( - ) blurriness of vision, ( - ) double vision, ( - ) watery eyes Ears, nose, mouth, throat, and face: ( - ) mucositis, ( - ) sore throat Respiratory: ( - ) cough, ( - ) dyspnea, ( - ) wheezes Cardiovascular: ( - ) palpitation, ( - ) chest discomfort, ( - ) lower extremity swelling Gastrointestinal:  ( - ) nausea, ( - ) heartburn, ( - ) change in bowel habits Skin: ( - ) abnormal skin rashes Lymphatics: ( - ) new lymphadenopathy, ( - ) easy bruising Neurological: ( - ) numbness, ( - ) tingling, ( - ) new weaknesses Behavioral/Psych: ( - ) mood change, ( - ) new changes  All other systems were reviewed with the patient and are negative.  PHYSICAL EXAMINATION:  Vitals:   04/19/24 0918  BP: 103/72  Pulse: 72  Resp: 14  Temp: (!) 97.2 F (36.2 C)  SpO2: 100%   Filed Weights   04/19/24 0918  Weight: 127 lb 11.2 oz (57.9 kg)     GENERAL: well appearing young Caucasian female in NAD  SKIN: skin color, texture, turgor are normal, no rashes or significant lesions EYES: conjunctiva are pink and non-injected, sclera clear LUNGS: clear to auscultation and percussion with normal breathing effort HEART: regular rate & rhythm and no murmurs and no lower extremity edema Musculoskeletal: no cyanosis of digits and no clubbing  PSYCH: alert & oriented x 3, fluent speech NEURO: no focal motor/sensory deficits  LABORATORY DATA:  I have reviewed the data as listed    Latest Ref Rng & Units 04/19/2024   10:04 AM 02/20/2024   12:22 PM 08/24/2020   11:49 AM  CBC  WBC 4.0 - 10.5 K/uL 5.4  7.1  7.2   Hemoglobin 12.0 - 15.0 g/dL 85.5  85.2  86.2   Hematocrit 36.0 - 46.0 % 42.9  44.8  40.0   Platelets 150 - 400 K/uL 214  227  247        Latest Ref Rng & Units 04/19/2024   10:04 AM 12/29/2022   10:52 AM 08/24/2020   11:29 AM  CMP  Glucose 70 - 99 mg/dL 73   76   BUN 6 - 20 mg/dL 12   7   Creatinine 9.55 - 1.00 mg/dL 9.13   9.09   Sodium 864 - 145 mmol/L 140   139   Potassium 3.5 - 5.1 mmol/L 3.9   4.0   Chloride 98 - 111 mmol/L 106   104   CO2 22 - 32 mmol/L 27   28   Calcium 8.9 - 10.3 mg/dL 9.2   9.4   Total Protein 6.5 - 8.1 g/dL 7.9  7.0  6.7   Total Bilirubin 0.0 - 1.2 mg/dL 0.3  0.6  0.5   Alkaline Phos 38 - 126 U/L 88     AST 15 - 41 U/L 15  21  20    ALT 0 - 44 U/L 12  15  28       ASSESSMENT & PLAN Barbara Mcmillan 23 y.o. female with medical history significant for depression who presents for evaluation of mildly low von Willebrand and Factor VII levels.   After review of the labs, review of the records, and discussion with the patient the patients findings are most consistent with mildly low von Willebrand and factor VIII levels, likely not of clinical significance.  # Mildly Low Von Willebrand Levels # Mildly Low Factor VIII Levels -- Will repeat von Willebrand testing today in order to reevaluate. --  Patient has heavy menstrual cycles and occasional nosebleeding but no other signs concerning for a coagulopathy. -- Due to her heavy menstrual cycles we will also order iron levels today.  Will order iron panel and ferritin.  Additionally will order PT/INR. -- Return to clinic pending the results of the above studies.  Orders Placed This Encounter  Procedures   CBC with Differential (Cancer Center Only)    Standing Status:   Future    Number of Occurrences:   1    Expiration Date:   04/19/2025   CMP (Cancer Center only)    Standing Status:   Future    Number of Occurrences:   1    Expiration Date:   04/19/2025   Ferritin    Standing Status:   Future    Number of Occurrences:   1    Expiration Date:   04/19/2025   Iron and Iron Binding Capacity ([facility] only)    Standing Status:   Future    Number of Occurrences:   1    Expiration Date:   04/19/2025   Retic Panel    Standing Status:   Future    Number of Occurrences:   1    Expiration Date:   04/19/2025   Von Willebrand panel    Standing Status:   Future    Number of Occurrences:   1    Expiration Date:   04/19/2025   Von Willebrand Multimeric    Standing Status:   Future    Number of Occurrences:   1    Expiration Date:   04/19/2025   Protime-INR    Standing Status:   Future    Number of Occurrences:   1    Expiration Date:   04/19/2025    All questions were answered. The patient knows to call the clinic with any problems, questions or concerns.  A total of more than 60 minutes were spent on this encounter with face-to-face time and non-face-to-face time, including preparing to see the patient, ordering tests and/or medications, counseling the patient and coordination of care as outlined above.   Norleen IVAR Kidney, MD Department of Hematology/Oncology [facility] Phone: [phone] Pager: [phone] Email: [email or web address]  04/20/2024 5:03 PM

## 2024-04-19 ENCOUNTER — Inpatient Hospital Stay: Attending: Hematology and Oncology | Admitting: Hematology and Oncology

## 2024-04-19 ENCOUNTER — Inpatient Hospital Stay

## 2024-04-19 VITALS — BP 103/72 | HR 72 | Temp 97.2°F | Resp 14 | Wt 127.7 lb

## 2024-04-19 DIAGNOSIS — Z801 Family history of malignant neoplasm of trachea, bronchus and lung: Secondary | ICD-10-CM | POA: Insufficient documentation

## 2024-04-19 DIAGNOSIS — F17210 Nicotine dependence, cigarettes, uncomplicated: Secondary | ICD-10-CM | POA: Insufficient documentation

## 2024-04-19 DIAGNOSIS — N920 Excessive and frequent menstruation with regular cycle: Secondary | ICD-10-CM | POA: Insufficient documentation

## 2024-04-19 DIAGNOSIS — N921 Excessive and frequent menstruation with irregular cycle: Secondary | ICD-10-CM | POA: Diagnosis not present

## 2024-04-19 DIAGNOSIS — D500 Iron deficiency anemia secondary to blood loss (chronic): Secondary | ICD-10-CM

## 2024-04-19 DIAGNOSIS — D6800 Von Willebrand disease, unspecified: Secondary | ICD-10-CM | POA: Insufficient documentation

## 2024-04-19 DIAGNOSIS — R779 Abnormality of plasma protein, unspecified: Secondary | ICD-10-CM | POA: Insufficient documentation

## 2024-04-19 LAB — CBC WITH DIFFERENTIAL (CANCER CENTER ONLY)
Abs Immature Granulocytes: 0.01 K/uL (ref 0.00–0.07)
Basophils Absolute: 0.1 K/uL (ref 0.0–0.1)
Basophils Relative: 1 %
Eosinophils Absolute: 0.2 K/uL (ref 0.0–0.5)
Eosinophils Relative: 3 %
HCT: 42.9 % (ref 36.0–46.0)
Hemoglobin: 14.4 g/dL (ref 12.0–15.0)
Immature Granulocytes: 0 %
Lymphocytes Relative: 36 %
Lymphs Abs: 2 K/uL (ref 0.7–4.0)
MCH: 31.1 pg (ref 26.0–34.0)
MCHC: 33.6 g/dL (ref 30.0–36.0)
MCV: 92.7 fL (ref 80.0–100.0)
Monocytes Absolute: 0.5 K/uL (ref 0.1–1.0)
Monocytes Relative: 9 %
Neutro Abs: 2.8 K/uL (ref 1.7–7.7)
Neutrophils Relative %: 51 %
Platelet Count: 214 K/uL (ref 150–400)
RBC: 4.63 MIL/uL (ref 3.87–5.11)
RDW: 11.9 % (ref 11.5–15.5)
WBC Count: 5.4 K/uL (ref 4.0–10.5)
nRBC: 0 % (ref 0.0–0.2)

## 2024-04-19 LAB — RETIC PANEL
Immature Retic Fract: 2.8 % (ref 2.3–15.9)
RBC.: 4.66 MIL/uL (ref 3.87–5.11)
Retic Count, Absolute: 60.6 K/uL (ref 19.0–186.0)
Retic Ct Pct: 1.3 % (ref 0.4–3.1)
Reticulocyte Hemoglobin: 34.5 pg (ref >27.9)

## 2024-04-19 LAB — IRON AND IRON BINDING CAPACITY (CC-WL,HP ONLY)
Iron: 76 ug/dL (ref 28–170)
Saturation Ratios: 19 % (ref 10.4–31.8)
TIBC: 396 ug/dL (ref 250–450)
UIBC: 320 ug/dL (ref 148–442)

## 2024-04-19 LAB — CMP (CANCER CENTER ONLY)
ALT: 12 U/L (ref 0–44)
AST: 15 U/L (ref 15–41)
Albumin: 4.8 g/dL (ref 3.5–5.0)
Alkaline Phosphatase: 88 U/L (ref 38–126)
Anion gap: 7 (ref 5–15)
BUN: 12 mg/dL (ref 6–20)
CO2: 27 mmol/L (ref 22–32)
Calcium: 9.2 mg/dL (ref 8.9–10.3)
Chloride: 106 mmol/L (ref 98–111)
Creatinine: 0.86 mg/dL (ref 0.44–1.00)
GFR, Estimated: >60 mL/min (ref >60)
Glucose, Bld: 73 mg/dL (ref 70–99)
Potassium: 3.9 mmol/L (ref 3.5–5.1)
Sodium: 140 mmol/L (ref 135–145)
Total Bilirubin: 0.3 mg/dL (ref 0.0–1.2)
Total Protein: 7.9 g/dL (ref 6.5–8.1)

## 2024-04-19 LAB — PROTIME-INR
INR: 1 (ref 0.8–1.2)
Prothrombin Time: 14.3 s (ref 11.4–15.2)

## 2024-04-19 LAB — FERRITIN: Ferritin: 33 ng/mL (ref 11–307)

## 2024-04-20 LAB — VON WILLEBRAND PANEL
Coagulation Factor VIII: 51 % — ABNORMAL LOW (ref 56–140)
Ristocetin Co-factor, Plasma: 47 % — ABNORMAL LOW (ref 50–200)
Von Willebrand Antigen, Plasma: 48 % — ABNORMAL LOW (ref 50–200)

## 2024-04-20 LAB — COAG STUDIES INTERP REPORT

## 2024-04-24 ENCOUNTER — Ambulatory Visit: Payer: Self-pay | Admitting: *Deleted

## 2024-04-24 NOTE — Telephone Encounter (Signed)
-----   Message from Norleen ONEIDA Kidney IV sent at 04/20/2024  5:03 PM EDT ----- Please let Mrs. Clare [facility]) know that her von Willebrand levels and factor VIII levels were mildly low.  They were not low enough for her to qualify for von Willebrand's disease.  At this time I  have a very low suspicion she has a coagulopathy.  I recommend routine follow-up with her OB/GYN for management of her heavy menstrual cycles.  Additionally her iron levels were borderline low.  At  this time she does not require p.o. iron therapy but I would recommend she increase iron in her diet.  There is no need for routine follow-up in our clinic.  We are happy to see her back if she  develops new or concerning symptoms. ----- Message ----- From: Rebecka, Lab In [place] Sent: 04/19/2024  10:41 AM EDT To: Norleen ONEIDA Kidney [place], MD

## 2024-04-24 NOTE — Telephone Encounter (Signed)
 TCT patient regarding recent lab results.  Spoke with her. Advised  that her von Willebrand levels and factor VIII levels were mildly low.  They were not low enough for her to qualify for von Willebrand's disease.  At this time Dr. Federico has a very low suspicion she has a coagulopathy.  He recommends routine follow-up with her OB/GYN for management of her heavy menstrual cycles.  Additionally her iron levels were borderline low.  At  this time she does not require p.o. iron therapy but he recommends she increase iron in her diet.  There is no need for routine follow-up in our clinic.  We are happy to see her back if she  develops new or concerning symptoms. Pt voiced understanding. No questions or concerns at this time.

## 2024-05-06 LAB — VON WILLEBRAND FACTOR MULTIMER

## 2024-05-20 ENCOUNTER — Ambulatory Visit
Admission: RE | Admit: 2024-05-20 | Discharge: 2024-05-20 | Disposition: A | Discharge status: Home / Self Care | Source: Ambulatory Visit | Attending: Obstetrics and Gynecology | Admitting: Obstetrics and Gynecology

## 2024-05-20 DIAGNOSIS — N921 Excessive and frequent menstruation with irregular cycle: Secondary | ICD-10-CM

## 2024-05-21 ENCOUNTER — Ambulatory Visit: Payer: Self-pay | Admitting: Obstetrics and Gynecology

## 2024-08-09 ENCOUNTER — Other Ambulatory Visit: Payer: Self-pay | Admitting: Obstetrics and Gynecology

## 2024-08-09 DIAGNOSIS — Z3041 Encounter for surveillance of contraceptive pills: Secondary | ICD-10-CM

## 2024-08-09 NOTE — Telephone Encounter (Signed)
Duplicate: Please disregard 

## 2024-08-09 NOTE — Telephone Encounter (Signed)
 Med refill request:   norethindrone  (MICRONOR ) 0.35 MG tablet  Start:  01/03/24 Disp:  84 tablets Refills:  1  Last AEX:  01/03/24 Next AEX:  Not yet scheduled Last MMG (if hormonal med):  N/A Refill authorized? Please Advise.
